# Patient Record
Sex: Male | Born: 1945 | Race: White | Hispanic: No | Marital: Married | Smoking: Former smoker
Health system: Southern US, Community
[De-identification: ages and names within clinical notes are randomized; demographics above are authoritative.]

## PROBLEM LIST (undated history)

## (undated) DIAGNOSIS — I219 Acute myocardial infarction, unspecified: Secondary | ICD-10-CM

## (undated) DIAGNOSIS — I10 Essential (primary) hypertension: Secondary | ICD-10-CM

## (undated) DIAGNOSIS — E119 Type 2 diabetes mellitus without complications: Secondary | ICD-10-CM

## (undated) DIAGNOSIS — I2510 Atherosclerotic heart disease of native coronary artery without angina pectoris: Secondary | ICD-10-CM

## (undated) DIAGNOSIS — I70209 Unspecified atherosclerosis of native arteries of extremities, unspecified extremity: Secondary | ICD-10-CM

## (undated) DIAGNOSIS — I259 Chronic ischemic heart disease, unspecified: Secondary | ICD-10-CM

## (undated) DIAGNOSIS — D869 Sarcoidosis, unspecified: Secondary | ICD-10-CM

## (undated) DIAGNOSIS — N529 Male erectile dysfunction, unspecified: Secondary | ICD-10-CM

## (undated) DIAGNOSIS — K219 Gastro-esophageal reflux disease without esophagitis: Secondary | ICD-10-CM

## (undated) DIAGNOSIS — E782 Mixed hyperlipidemia: Secondary | ICD-10-CM

## (undated) DIAGNOSIS — R7303 Prediabetes: Secondary | ICD-10-CM

## (undated) HISTORY — PX: REPLACEMENT TOTAL KNEE BILATERAL: SUR1225

## (undated) HISTORY — PX: COLONOSCOPY: SHX174

## (undated) HISTORY — PX: HERNIA REPAIR: SHX51

## (undated) HISTORY — PX: OTHER SURGICAL HISTORY: SHX169

---

## 2005-03-13 HISTORY — PX: OTHER SURGICAL HISTORY: SHX169

## 2005-09-08 ENCOUNTER — Ambulatory Visit: Payer: Self-pay | Admitting: Gastroenterology

## 2005-12-20 ENCOUNTER — Other Ambulatory Visit: Payer: Self-pay

## 2005-12-20 ENCOUNTER — Inpatient Hospital Stay: Payer: Self-pay | Admitting: Internal Medicine

## 2005-12-23 ENCOUNTER — Other Ambulatory Visit: Payer: Self-pay

## 2005-12-23 ENCOUNTER — Inpatient Hospital Stay: Payer: Self-pay | Admitting: Internal Medicine

## 2005-12-24 ENCOUNTER — Other Ambulatory Visit: Payer: Self-pay

## 2005-12-25 ENCOUNTER — Other Ambulatory Visit: Payer: Self-pay

## 2006-01-10 ENCOUNTER — Encounter: Payer: Self-pay | Admitting: Internal Medicine

## 2006-01-11 ENCOUNTER — Encounter: Payer: Self-pay | Admitting: Internal Medicine

## 2006-02-10 ENCOUNTER — Encounter: Payer: Self-pay | Admitting: Internal Medicine

## 2006-03-13 ENCOUNTER — Encounter: Payer: Self-pay | Admitting: Internal Medicine

## 2006-04-13 ENCOUNTER — Encounter: Payer: Self-pay | Admitting: Internal Medicine

## 2006-05-12 ENCOUNTER — Encounter: Payer: Self-pay | Admitting: Internal Medicine

## 2006-06-12 ENCOUNTER — Encounter: Payer: Self-pay | Admitting: Internal Medicine

## 2007-05-29 ENCOUNTER — Ambulatory Visit: Payer: Self-pay

## 2007-06-25 ENCOUNTER — Ambulatory Visit: Payer: Self-pay | Admitting: Orthopaedic Surgery

## 2007-07-02 ENCOUNTER — Ambulatory Visit: Payer: Self-pay | Admitting: Orthopaedic Surgery

## 2007-09-30 ENCOUNTER — Ambulatory Visit: Payer: Self-pay | Admitting: General Practice

## 2007-10-14 ENCOUNTER — Inpatient Hospital Stay: Payer: Self-pay | Admitting: General Practice

## 2009-01-01 ENCOUNTER — Emergency Department: Payer: Self-pay | Admitting: Emergency Medicine

## 2010-01-17 ENCOUNTER — Ambulatory Visit: Payer: Self-pay | Admitting: Surgery

## 2010-02-07 ENCOUNTER — Ambulatory Visit: Payer: Self-pay | Admitting: Surgery

## 2011-01-04 ENCOUNTER — Ambulatory Visit: Payer: Self-pay | Admitting: Gastroenterology

## 2013-08-07 DIAGNOSIS — N529 Male erectile dysfunction, unspecified: Secondary | ICD-10-CM | POA: Insufficient documentation

## 2013-08-07 DIAGNOSIS — K219 Gastro-esophageal reflux disease without esophagitis: Secondary | ICD-10-CM | POA: Insufficient documentation

## 2013-10-31 ENCOUNTER — Ambulatory Visit: Payer: Self-pay | Admitting: Gastroenterology

## 2015-02-19 DIAGNOSIS — E119 Type 2 diabetes mellitus without complications: Secondary | ICD-10-CM | POA: Insufficient documentation

## 2016-05-30 ENCOUNTER — Ambulatory Visit: Admit: 2016-05-30 | Payer: Self-pay | Admitting: Gastroenterology

## 2016-05-30 SURGERY — COLONOSCOPY WITH PROPOFOL
Anesthesia: General

## 2016-09-15 ENCOUNTER — Encounter: Payer: Self-pay | Admitting: *Deleted

## 2016-09-18 ENCOUNTER — Ambulatory Visit: Payer: Medicare Other | Admitting: Anesthesiology

## 2016-09-18 ENCOUNTER — Ambulatory Visit
Admission: RE | Admit: 2016-09-18 | Discharge: 2016-09-18 | Disposition: A | Discharge status: Home / Self Care | Payer: Medicare Other | Source: Ambulatory Visit | Attending: Gastroenterology | Admitting: Gastroenterology

## 2016-09-18 ENCOUNTER — Encounter
Admission: RE | Disposition: A | Discharge status: Home / Self Care | Payer: Self-pay | Source: Ambulatory Visit | Attending: Gastroenterology

## 2016-09-18 DIAGNOSIS — N529 Male erectile dysfunction, unspecified: Secondary | ICD-10-CM | POA: Insufficient documentation

## 2016-09-18 DIAGNOSIS — I252 Old myocardial infarction: Secondary | ICD-10-CM | POA: Diagnosis not present

## 2016-09-18 DIAGNOSIS — Z800 Family history of malignant neoplasm of digestive organs: Secondary | ICD-10-CM | POA: Diagnosis not present

## 2016-09-18 DIAGNOSIS — Z7902 Long term (current) use of antithrombotics/antiplatelets: Secondary | ICD-10-CM | POA: Diagnosis not present

## 2016-09-18 DIAGNOSIS — Z87891 Personal history of nicotine dependence: Secondary | ICD-10-CM | POA: Insufficient documentation

## 2016-09-18 DIAGNOSIS — E782 Mixed hyperlipidemia: Secondary | ICD-10-CM | POA: Insufficient documentation

## 2016-09-18 DIAGNOSIS — Z1211 Encounter for screening for malignant neoplasm of colon: Secondary | ICD-10-CM | POA: Diagnosis not present

## 2016-09-18 DIAGNOSIS — Z79899 Other long term (current) drug therapy: Secondary | ICD-10-CM | POA: Insufficient documentation

## 2016-09-18 DIAGNOSIS — Z955 Presence of coronary angioplasty implant and graft: Secondary | ICD-10-CM | POA: Insufficient documentation

## 2016-09-18 DIAGNOSIS — K219 Gastro-esophageal reflux disease without esophagitis: Secondary | ICD-10-CM | POA: Diagnosis not present

## 2016-09-18 DIAGNOSIS — R7303 Prediabetes: Secondary | ICD-10-CM | POA: Diagnosis not present

## 2016-09-18 DIAGNOSIS — I2510 Atherosclerotic heart disease of native coronary artery without angina pectoris: Secondary | ICD-10-CM | POA: Diagnosis not present

## 2016-09-18 DIAGNOSIS — Z86010 Personal history of colonic polyps: Secondary | ICD-10-CM | POA: Insufficient documentation

## 2016-09-18 DIAGNOSIS — K5730 Diverticulosis of large intestine without perforation or abscess without bleeding: Secondary | ICD-10-CM | POA: Insufficient documentation

## 2016-09-18 DIAGNOSIS — I10 Essential (primary) hypertension: Secondary | ICD-10-CM | POA: Insufficient documentation

## 2016-09-18 DIAGNOSIS — I259 Chronic ischemic heart disease, unspecified: Secondary | ICD-10-CM | POA: Insufficient documentation

## 2016-09-18 DIAGNOSIS — K635 Polyp of colon: Secondary | ICD-10-CM | POA: Insufficient documentation

## 2016-09-18 DIAGNOSIS — K640 First degree hemorrhoids: Secondary | ICD-10-CM | POA: Insufficient documentation

## 2016-09-18 DIAGNOSIS — Z7982 Long term (current) use of aspirin: Secondary | ICD-10-CM | POA: Diagnosis not present

## 2016-09-18 HISTORY — DX: Gastro-esophageal reflux disease without esophagitis: K21.9

## 2016-09-18 HISTORY — DX: Acute myocardial infarction, unspecified: I21.9

## 2016-09-18 HISTORY — DX: Essential (primary) hypertension: I10

## 2016-09-18 HISTORY — DX: Prediabetes: R73.03

## 2016-09-18 HISTORY — PX: COLONOSCOPY WITH PROPOFOL: SHX5780

## 2016-09-18 HISTORY — DX: Male erectile dysfunction, unspecified: N52.9

## 2016-09-18 HISTORY — DX: Atherosclerotic heart disease of native coronary artery without angina pectoris: I25.10

## 2016-09-18 HISTORY — DX: Mixed hyperlipidemia: E78.2

## 2016-09-18 HISTORY — DX: Chronic ischemic heart disease, unspecified: I25.9

## 2016-09-18 SURGERY — COLONOSCOPY WITH PROPOFOL
Anesthesia: General

## 2016-09-18 MED ORDER — PROPOFOL 500 MG/50ML IV EMUL
INTRAVENOUS | Status: DC | PRN
  Administered 2016-09-18: 120 ug/kg/min via INTRAVENOUS

## 2016-09-18 MED ORDER — PROPOFOL 10 MG/ML IV BOLUS
INTRAVENOUS | Status: DC | PRN
  Administered 2016-09-18: 90 mg via INTRAVENOUS

## 2016-09-18 MED ORDER — SODIUM CHLORIDE 0.9 % IV SOLN: INTRAVENOUS | Status: DC

## 2016-09-18 MED ORDER — PROPOFOL 500 MG/50ML IV EMUL
INTRAVENOUS | Status: AC
  Filled 2016-09-18: qty 50

## 2016-09-18 MED ORDER — SODIUM CHLORIDE 0.9 % IV SOLN
INTRAVENOUS | Status: DC
  Administered 2016-09-18: 07:00:00 via INTRAVENOUS

## 2016-09-18 NOTE — Op Note (Signed)
[facility] Gastroenterology Patient Name: Casey Dyer Procedure Date: 09/18/2016 7:16 AM MRN: [id number] Account #: [id number] Date of Birth: 04-13-1945 Admit Type: Outpatient Age: [age] Room: [facility] 3 Gender: Male Note Status: Finalized Procedure:            Colonoscopy Indications:          Family history of colon cancer in a first-degree                        relative, Personal history of colonic polyps Providers:            Christena DeemMartin U. Santhosh Gulino, MD Referring MD:         Teena Iraniavid M. Terance HartBronstein, MD (Referring MD) Medicines:            Monitored Anesthesia Care Complications:        No immediate complications. Procedure:            Pre-Anesthesia Assessment:                       - ASA Grade Assessment: III - A patient with severe                        systemic disease.                       After obtaining informed consent, the colonoscope was                        passed under direct vision. Throughout the procedure,                        the patient's blood pressure, pulse, and oxygen                        saturations were monitored continuously. The                        Colonoscope was introduced through the anus and                        advanced to the the cecum, identified by appendiceal                        orifice and ileocecal valve. The colonoscopy was                        performed without difficulty. The patient tolerated the                        procedure well. The quality of the bowel preparation                        was good. Findings:      Multiple medium-mouthed diverticula were found in the sigmoid colon,       descending colon, transverse colon and ascending colon.      A 3 mm polyp was found in the mid sigmoid colon. The polyp was sessile.       The polyp was removed with a cold biopsy forceps. Resection and       retrieval were complete.  Non-bleeding internal hemorrhoids were found during digital exam and   during anoscopy. The hemorrhoids were small and Grade I (internal       hemorrhoids that do not prolapse). Impression:           - Diverticulosis in the sigmoid colon, in the                        descending colon, in the transverse colon and in the                        ascending colon.                       - One 3 mm polyp in the mid sigmoid colon, removed with                        a cold biopsy forceps. Resected and retrieved.                       - Non-bleeding internal hemorrhoids. Recommendation:       - Await pathology results.                       - Telephone GI clinic for pathology results in 1 week. Procedure Code(s):    --- Professional ---                       [phone], Colonoscopy, flexible; with biopsy, single or                        multiple Diagnosis Code(s):    --- Professional ---                       K64.0, First degree hemorrhoids                       D12.5, Benign neoplasm of sigmoid colon                       Z80.0, Family history of malignant neoplasm of                        digestive organs                       Z86.010, Personal history of colonic polyps                       K57.30, Diverticulosis of large intestine without                        perforation or abscess without bleeding CPT copyright 2016 American [employer]. All rights reserved. The codes documented in this report are preliminary and upon coder review may  be revised to meet current compliance requirements. Christena Deem, MD 09/18/2016 8:10:09 AM This report has been signed electronically. Number of Addenda: 0 Note Initiated On: 09/18/2016 7:16 AM Scope Withdrawal Time: 0 hours 12 minutes 39 seconds  Total Procedure Duration: 0 hours 20 minutes 17 seconds       [facility]

## 2016-09-18 NOTE — H&P (Signed)
Outpatient short stay form Pre-procedure 09/18/2016 7:39 AM Christena DeemMartin U Jager Koska MD  Primary Physician: Dr. Dorothey Basemanavid Bronstein, Dr. Arnoldo HookerBruce Kowalski  Reason for visit:  Colonoscopy  History of present illness:  Patient is a 71 year old male presenting today as above. He tolerated his prep well. He takes Plavix and aspirin having held the Plavix now for 7 days and his aspirin for about 3. He takes no other blood thinning agents or aspirin products.    Current Facility-Administered Medications:  .  0.9 %  sodium chloride infusion, , Intravenous, Continuous, Christena DeemSkulskie, Myya Meenach U, MD, Last Rate: 20 mL/hr at 09/18/16 0727 .  0.9 %  sodium chloride infusion, , Intravenous, Continuous, Christena DeemSkulskie, Lathen Seal U, MD  Prescriptions Prior to Admission  Medication Sig Dispense Refill Last Dose  . aspirin 81 MG chewable tablet Chew by mouth daily.   09/13/2016  . celecoxib (CELEBREX) 200 MG capsule Take 200 mg by mouth 2 (two) times daily.   09/13/2016  . clopidogrel (PLAVIX) 75 MG tablet Take 75 mg by mouth daily.   09/11/2016  . glucosamine-chondroitin 500-400 MG tablet Take 1 tablet by mouth 3 (three) times daily.   09/13/2016  . lisinopril (PRINIVIL,ZESTRIL) 20 MG tablet Take 20 mg by mouth daily.   09/18/2016 at Unknown time  . metoprolol succinate (TOPROL-XL) 25 MG 24 hr tablet Take 25 mg by mouth daily.   Past Week at Unknown time  . Multiple Vitamins-Minerals (MULTIVITAMIN ADULT PO) Take by mouth.   09/13/2016  . Omega-3 Fatty Acids (FISH OIL) 1000 MG CAPS Take by mouth.   09/13/2016  . ranitidine (ZANTAC) 150 MG capsule Take 150 mg by mouth 2 (two) times daily.   Past Week at Unknown time  . simvastatin (ZOCOR) 40 MG tablet Take 40 mg by mouth daily.   09/17/2016  . tadalafil (CIALIS) 20 MG tablet Take 20 mg by mouth daily as needed for erectile dysfunction.   Past Week at Unknown time     No Known Allergies   Past Medical History:  Diagnosis Date  . Chronic ischemic heart disease   . Coronary artery disease   . ED  (erectile dysfunction)   . GERD (gastroesophageal reflux disease)   . Hypertension   . Mixed hyperlipidemia   . Myocardial infarction (HCC)    2007  . Pre-diabetes    borderline     Review of systems:      Physical Exam    Heart and lungs: Regular rate and rhythm without rub or gallop, lungs are bilaterally clear.    HEENT: Normocephalic atraumatic eyes are anicteric    Other:     Pertinant exam for procedure: Soft nontender nondistended bowel sounds positive normoactive.    Planned proceedures: Colonoscopy and indicated procedures. I have discussed the risks benefits and complications of procedures to include not limited to bleeding, infection, perforation and the risk of sedation and the patient wishes to proceed.    Christena DeemMartin U Sahirah Rudell, MD Gastroenterology 09/18/2016  7:39 AM

## 2016-09-18 NOTE — Anesthesia Post-op Follow-up Note (Cosign Needed)
Anesthesia QCDR form completed.        

## 2016-09-18 NOTE — Anesthesia Preprocedure Evaluation (Signed)
Anesthesia Evaluation  Patient identified by MRN, date of birth, ID band Patient awake    Reviewed: Allergy & Precautions, H&P , NPO status , Patient's Chart, lab work & pertinent test results  History of Anesthesia Complications Negative for: history of anesthetic complications  Airway Mallampati: III  TM Distance: <3 FB Neck ROM: limited    Dental  (+) Chipped, Caps   Pulmonary neg shortness of breath, former smoker,           Cardiovascular Exercise Tolerance: Good hypertension, (-) angina+ CAD, + Past MI and + Cardiac Stents  (-) DOE      Neuro/Psych negative neurological ROS  negative psych ROS   GI/Hepatic Neg liver ROS, GERD  Medicated and Controlled,  Endo/Other  negative endocrine ROS  Renal/GU negative Renal ROS  negative genitourinary   Musculoskeletal   Abdominal   Peds  Hematology negative hematology ROS (+)   Anesthesia Other Findings Past Medical History: No date: Chronic ischemic heart disease No date: Coronary artery disease No date: ED (erectile dysfunction) No date: GERD (gastroesophageal reflux disease) No date: Hypertension No date: Mixed hyperlipidemia No date: Myocardial infarction [facility](HCC)     Comment: 2007 No date: Pre-diabetes     Comment: borderline   Past Surgical History: No date: COLONOSCOPY No date: heart stents No date: HERNIA REPAIR No date: partial right knee replacement Right No date: polpectomy No date: rtk Right  BMI    Body Mass Index:  27.89 kg/m      Reproductive/Obstetrics negative OB ROS                             Anesthesia Physical Anesthesia Plan  ASA: III  Anesthesia Plan: General   Post-op Pain Management:    Induction: Intravenous  PONV Risk Score and Plan:   Airway Management Planned: Natural Airway and Nasal Cannula  Additional Equipment:   Intra-op Plan:   Post-operative Plan:   Informed Consent: I have  reviewed the patients History and Physical, chart, labs and discussed the procedure including the risks, benefits and alternatives for the proposed anesthesia with the patient or authorized representative who has indicated his/her understanding and acceptance.   Dental Advisory Given  Plan Discussed with: Anesthesiologist, CRNA and Surgeon  Anesthesia Plan Comments: (Patient consented for risks of anesthesia including but not limited to:  - adverse reactions to medications - damage to teeth, lips or other oral mucosa - sore throat or hoarseness - Damage to heart, brain, lungs or loss of life  Patient voiced understanding.)        Anesthesia Quick Evaluation

## 2016-09-18 NOTE — Transfer of Care (Signed)
Immediate Anesthesia Transfer of Care Note  Patient: Casey Dyer  Procedure(s) Performed: Procedure(s): COLONOSCOPY WITH PROPOFOL (N/A)  Patient Location: PACU and Endoscopy Unit  Anesthesia Type:General  Level of Consciousness: drowsy and patient cooperative  Airway & Oxygen Therapy: Patient Spontanous Breathing and Patient connected to nasal cannula oxygen  Post-op Assessment: Report given to RN and Post -op Vital signs reviewed and stable  Post vital signs: Reviewed and stable  Last Vitals:  Vitals:   09/18/16 0710 09/18/16 0810  BP: (!) 155/82 117/66  Pulse: 65 68  Resp: 18 20  Temp:  36.4 C    Last Pain:  Vitals:   09/18/16 0810  TempSrc: Tympanic         Complications: No apparent anesthesia complications

## 2016-09-18 NOTE — Anesthesia Postprocedure Evaluation (Signed)
Anesthesia Post Note  Patient: Casey Dyer  Procedure(s) Performed: Procedure(s) (LRB): COLONOSCOPY WITH PROPOFOL (N/A)  Patient location during evaluation: Endoscopy Anesthesia Type: General Level of consciousness: awake and alert Pain management: pain level controlled Vital Signs Assessment: post-procedure vital signs reviewed and stable Respiratory status: spontaneous breathing, nonlabored ventilation, respiratory function stable and patient connected to nasal cannula oxygen Cardiovascular status: blood pressure returned to baseline and stable Postop Assessment: no signs of nausea or vomiting Anesthetic complications: no     Last Vitals:  Vitals:   09/18/16 0830 09/18/16 0840  BP: (!) 146/79 (!) 138/104  Pulse: 62 (!) 57  Resp: 14 11  Temp:      Last Pain:  Vitals:   09/18/16 0810  TempSrc: Tympanic                 Cleda MccreedyJoseph K Piscitello

## 2016-09-19 ENCOUNTER — Encounter: Payer: Self-pay | Admitting: Gastroenterology

## 2016-09-20 LAB — SURGICAL PATHOLOGY

## 2019-05-21 ENCOUNTER — Other Ambulatory Visit: Payer: Self-pay | Admitting: Podiatry

## 2019-05-21 NOTE — Discharge Instructions (Signed)
[facility]  POST OPERATIVE INSTRUCTIONS FOR DR. TROXLER, DR. FOWLER, AND DR. [facility] PODIATRY DEPARTMENT   1. Take your medication as prescribed.  Pain medication should be taken only as needed.  2. Keep the dressing clean, dry and intact.  3. Keep your foot elevated above the heart level for the first 48 hours.  4. Walking to the bathroom and brief periods of walking are acceptable, unless we have instructed you to be non-weight bearing.  5. Always wear your post-op shoe when walking.  Always use your crutches if you are to be non-weight bearing.  6. Do not take a shower. Baths are permissible as long as the foot is kept out of the water.   7. Every hour you are awake:  - Bend your knee 15 times. - Flex foot 15 times - Massage calf 15 times  8. Call [facility] ([phone]) if any of the following problems occur: - You develop a temperature or fever. - The bandage becomes saturated with blood. - Medication does not stop your pain. - Injury of the foot occurs. - Any symptoms of infection including redness, odor, or red streaks running from wound.   General Anesthesia, Adult, Care After This sheet gives you information about how to care for yourself after your procedure. Your health care provider may also give you more specific instructions. If you have problems or questions, contact your health care provider. What can I expect after the procedure? After the procedure, the following side effects are common:  Pain or discomfort at the IV site.  Nausea.  Vomiting.  Sore throat.  Trouble concentrating.  Feeling cold or chills.  Weak or tired.  Sleepiness and fatigue.  Soreness and body aches. These side effects can affect parts of the body that were not involved in surgery. Follow these instructions at home:  For at least 24 hours after the procedure:  Have a responsible adult stay with you. It is  important to have someone help care for you until you are awake and alert.  Rest as needed.  Do not: ? Participate in activities in which you could fall or become injured. ? Drive. ? Use heavy machinery. ? Drink alcohol. ? Take sleeping pills or medicines that cause drowsiness. ? Make important decisions or sign legal documents. ? Take care of children on your own. Eating and drinking  Follow any instructions from your health care provider about eating or drinking restrictions.  When you feel hungry, start by eating small amounts of foods that are soft and easy to digest (bland), such as toast. Gradually return to your regular diet.  Drink enough fluid to keep your urine pale yellow.  If you vomit, rehydrate by drinking water, juice, or clear broth. General instructions  If you have sleep apnea, surgery and certain medicines can increase your risk for breathing problems. Follow instructions from your health care provider about wearing your sleep device: ? Anytime you are sleeping, including during daytime naps. ? While taking prescription pain medicines, sleeping medicines, or medicines that make you drowsy.  Return to your normal activities as told by your health care provider. Ask your health care provider what activities are safe for you.  Take over-the-counter and prescription medicines only as told by your health care provider.  If you smoke, do not smoke without supervision.  Keep all follow-up visits as told by your health care provider. This is important. Contact a health care provider if:    You have nausea or vomiting that does not get better with medicine.  You cannot eat or drink without vomiting.  You have pain that does not get better with medicine.  You are unable to pass urine.  You develop a skin rash.  You have a fever.  You have redness around your IV site that gets worse. Get help right away if:  You have difficulty breathing.  You have chest  pain.  You have blood in your urine or stool, or you vomit blood. Summary  After the procedure, it is common to have a sore throat or nausea. It is also common to feel tired.  Have a responsible adult stay with you for the first 24 hours after general anesthesia. It is important to have someone help care for you until you are awake and alert.  When you feel hungry, start by eating small amounts of foods that are soft and easy to digest (bland), such as toast. Gradually return to your regular diet.  Drink enough fluid to keep your urine pale yellow.  Return to your normal activities as told by your health care provider. Ask your health care provider what activities are safe for you. This information is not intended to replace advice given to you by your health care provider. Make sure you discuss any questions you have with your health care provider. Document Revised: 03/02/2017 Document Reviewed: 10/13/2016 Elsevier Patient Education  2020 [employer]  

## 2019-05-22 ENCOUNTER — Encounter: Payer: Self-pay | Admitting: Podiatry

## 2019-05-22 NOTE — Anesthesia Preprocedure Evaluation (Addendum)
Anesthesia Evaluation  Patient identified by MRN, date of birth, ID band Patient awake    Reviewed: Allergy & Precautions, H&P , NPO status , Patient's Chart, lab work & pertinent test results  Airway Mallampati: II  TM Distance: >3 FB Neck ROM: full    Dental no notable dental hx.    Pulmonary former smoker,    Pulmonary exam normal breath sounds clear to auscultation       Cardiovascular hypertension, + CAD and + Past MI  Normal cardiovascular exam Rhythm:regular Rate:Normal     Neuro/Psych    GI/Hepatic GERD  ,  Endo/Other  diabetes, Type 2, Oral Hypoglycemic Agents  Renal/GU      Musculoskeletal   Abdominal   Peds  Hematology   Anesthesia Other Findings Cardiac eval/clearance in care everywhere from 04/01/2019: The patient is scheduled to undergo toe surgery under general anesthesia. The patient has a known history of coronary artery disease and cardiomyopathy, for which he receives medication management. Medication regimen has been optimized and the patient is not experiencing any acute symptoms related to their pre-existing cardiac diagnoses. EKG performed in the office today shows no significant changes from prior. The scheduled procedure is low risk and the patient is considered at lowest possible risk for cardiac complications from sedation, so they may proceed to their scheduled procedure as scheduled.   Reproductive/Obstetrics                            Anesthesia Physical Anesthesia Plan  ASA: III  Anesthesia Plan: General LMA   Post-op Pain Management:    Induction:   PONV Risk Score and Plan: 2 and Treatment may vary due to age or medical condition, Ondansetron, Dexamethasone and Midazolam  Airway Management Planned:   Additional Equipment:   Intra-op Plan:   Post-operative Plan:   Informed Consent: I have reviewed the patients History and Physical, chart, labs and  discussed the procedure including the risks, benefits and alternatives for the proposed anesthesia with the patient or authorized representative who has indicated his/her understanding and acceptance.     Dental Advisory Given  Plan Discussed with: CRNA  Anesthesia Plan Comments:         Anesthesia Quick Evaluation

## 2019-05-27 ENCOUNTER — Other Ambulatory Visit
Admission: RE | Admit: 2019-05-27 | Discharge: 2019-05-27 | Disposition: A | Discharge status: Home / Self Care | Payer: Medicare Other | Source: Ambulatory Visit | Attending: Podiatry | Admitting: Podiatry

## 2019-05-27 ENCOUNTER — Other Ambulatory Visit: Payer: Self-pay

## 2019-05-27 DIAGNOSIS — Z20822 Contact with and (suspected) exposure to covid-19: Secondary | ICD-10-CM | POA: Insufficient documentation

## 2019-05-27 DIAGNOSIS — Z01812 Encounter for preprocedural laboratory examination: Secondary | ICD-10-CM | POA: Diagnosis present

## 2019-05-27 LAB — SARS CORONAVIRUS 2 (TAT 6-24 HRS): SARS Coronavirus 2: NEGATIVE

## 2019-05-29 ENCOUNTER — Ambulatory Visit: Payer: Medicare Other | Admitting: Anesthesiology

## 2019-05-29 ENCOUNTER — Encounter
Admission: RE | Disposition: A | Discharge status: Home / Self Care | Payer: Self-pay | Source: Home / Self Care | Attending: Podiatry

## 2019-05-29 ENCOUNTER — Ambulatory Visit
Admission: RE | Admit: 2019-05-29 | Discharge: 2019-05-29 | Disposition: A | Discharge status: Home / Self Care | Payer: Medicare Other | Attending: Podiatry | Admitting: Podiatry

## 2019-05-29 ENCOUNTER — Other Ambulatory Visit: Payer: Self-pay

## 2019-05-29 ENCOUNTER — Encounter: Payer: Self-pay | Admitting: Podiatry

## 2019-05-29 DIAGNOSIS — I119 Hypertensive heart disease without heart failure: Secondary | ICD-10-CM | POA: Diagnosis not present

## 2019-05-29 DIAGNOSIS — Z96651 Presence of right artificial knee joint: Secondary | ICD-10-CM | POA: Diagnosis not present

## 2019-05-29 DIAGNOSIS — I252 Old myocardial infarction: Secondary | ICD-10-CM | POA: Diagnosis not present

## 2019-05-29 DIAGNOSIS — K219 Gastro-esophageal reflux disease without esophagitis: Secondary | ICD-10-CM | POA: Insufficient documentation

## 2019-05-29 DIAGNOSIS — Z955 Presence of coronary angioplasty implant and graft: Secondary | ICD-10-CM | POA: Diagnosis not present

## 2019-05-29 DIAGNOSIS — M2041 Other hammer toe(s) (acquired), right foot: Secondary | ICD-10-CM | POA: Diagnosis not present

## 2019-05-29 DIAGNOSIS — D869 Sarcoidosis, unspecified: Secondary | ICD-10-CM | POA: Insufficient documentation

## 2019-05-29 DIAGNOSIS — Z87891 Personal history of nicotine dependence: Secondary | ICD-10-CM | POA: Insufficient documentation

## 2019-05-29 DIAGNOSIS — Z7984 Long term (current) use of oral hypoglycemic drugs: Secondary | ICD-10-CM | POA: Insufficient documentation

## 2019-05-29 DIAGNOSIS — G8929 Other chronic pain: Secondary | ICD-10-CM | POA: Diagnosis not present

## 2019-05-29 DIAGNOSIS — M205X1 Other deformities of toe(s) (acquired), right foot: Secondary | ICD-10-CM | POA: Insufficient documentation

## 2019-05-29 DIAGNOSIS — L97519 Non-pressure chronic ulcer of other part of right foot with unspecified severity: Secondary | ICD-10-CM | POA: Diagnosis not present

## 2019-05-29 DIAGNOSIS — I2510 Atherosclerotic heart disease of native coronary artery without angina pectoris: Secondary | ICD-10-CM | POA: Insufficient documentation

## 2019-05-29 DIAGNOSIS — Z79899 Other long term (current) drug therapy: Secondary | ICD-10-CM | POA: Insufficient documentation

## 2019-05-29 DIAGNOSIS — E11621 Type 2 diabetes mellitus with foot ulcer: Secondary | ICD-10-CM | POA: Insufficient documentation

## 2019-05-29 DIAGNOSIS — I43 Cardiomyopathy in diseases classified elsewhere: Secondary | ICD-10-CM | POA: Diagnosis not present

## 2019-05-29 DIAGNOSIS — N529 Male erectile dysfunction, unspecified: Secondary | ICD-10-CM | POA: Insufficient documentation

## 2019-05-29 DIAGNOSIS — Z7982 Long term (current) use of aspirin: Secondary | ICD-10-CM | POA: Diagnosis not present

## 2019-05-29 DIAGNOSIS — E785 Hyperlipidemia, unspecified: Secondary | ICD-10-CM | POA: Insufficient documentation

## 2019-05-29 HISTORY — PX: BONE EXCISION: SHX6730

## 2019-05-29 HISTORY — DX: Type 2 diabetes mellitus without complications: E11.9

## 2019-05-29 LAB — GLUCOSE, CAPILLARY
Glucose-Capillary: 100 mg/dL — ABNORMAL HIGH (ref 70–99)
Glucose-Capillary: 108 mg/dL — ABNORMAL HIGH (ref 70–99)

## 2019-05-29 SURGERY — BONE EXCISION
Anesthesia: General | Site: Foot | Laterality: Right

## 2019-05-29 MED ORDER — BUPIVACAINE HCL (PF) 0.5 % IJ SOLN
INTRAMUSCULAR | Status: DC | PRN
  Administered 2019-05-29: 3 mL
  Administered 2019-05-29: 5 mL

## 2019-05-29 MED ORDER — LACTATED RINGERS IV SOLN: INTRAVENOUS | Status: DC

## 2019-05-29 MED ORDER — CEFAZOLIN SODIUM-DEXTROSE 2-4 GM/100ML-% IV SOLN: 2.0000 g | INTRAVENOUS | Status: DC

## 2019-05-29 MED ORDER — HYDROCODONE-ACETAMINOPHEN 5-325 MG PO TABS: 1.0000 | ORAL_TABLET | Freq: Four times a day (QID) | ORAL | 0 refills | Status: DC | PRN

## 2019-05-29 MED ORDER — POVIDONE-IODINE 7.5 % EX SOLN: Freq: Once | CUTANEOUS | Status: AC

## 2019-05-29 SURGICAL SUPPLY — 27 items
BLADE MINI RND TIP GREEN BEAV (BLADE) ×3 IMPLANT
BLADE OSC/SAGITTAL MD 5.5X18 (BLADE) ×3 IMPLANT
BNDG ESMARK 4X12 TAN STRL LF (GAUZE/BANDAGES/DRESSINGS) ×3 IMPLANT
BNDG GAUZE 4.5X4.1 6PLY STRL (MISCELLANEOUS) ×3 IMPLANT
BNDG STRETCH 4X75 STRL LF (GAUZE/BANDAGES/DRESSINGS) ×3 IMPLANT
CANISTER SUCT 1200ML W/VALVE (MISCELLANEOUS) ×3 IMPLANT
COVER LIGHT HANDLE UNIVERSAL (MISCELLANEOUS) ×6 IMPLANT
CUFF TOURN SGL QUICK 18X4 (TOURNIQUET CUFF) IMPLANT
DRAPE FLUOR MINI C-ARM 54X84 (DRAPES) ×3 IMPLANT
DURAPREP 26ML APPLICATOR (WOUND CARE) ×3 IMPLANT
ELECT REM PT RETURN 9FT ADLT (ELECTROSURGICAL) ×3
ELECTRODE REM PT RTRN 9FT ADLT (ELECTROSURGICAL) ×1 IMPLANT
GAUZE SPONGE 4X4 12PLY STRL (GAUZE/BANDAGES/DRESSINGS) ×3 IMPLANT
GAUZE XEROFORM 1X8 LF (GAUZE/BANDAGES/DRESSINGS) ×3 IMPLANT
GLOVE BIO SURGEON STRL SZ8 (GLOVE) ×3 IMPLANT
GOWN STRL REUS W/ TWL LRG LVL3 (GOWN DISPOSABLE) ×1 IMPLANT
GOWN STRL REUS W/ TWL XL LVL3 (GOWN DISPOSABLE) ×1 IMPLANT
GOWN STRL REUS W/TWL LRG LVL3 (GOWN DISPOSABLE) ×2
GOWN STRL REUS W/TWL XL LVL3 (GOWN DISPOSABLE) ×2
KIT TURNOVER KIT A (KITS) ×3 IMPLANT
NS IRRIG 500ML POUR BTL (IV SOLUTION) ×3 IMPLANT
PACK EXTREMITY ARMC (MISCELLANEOUS) ×3 IMPLANT
PENCIL SMOKE EVACUATOR (MISCELLANEOUS) ×3 IMPLANT
STOCKINETTE STRL 6IN 960660 (GAUZE/BANDAGES/DRESSINGS) ×3 IMPLANT
SUT ETHILON 5-0 FS-2 18 BLK (SUTURE) ×3 IMPLANT
SUT VIC AB 4-0 SH 27 (SUTURE) ×2
SUT VIC AB 4-0 SH 27XANBCTRL (SUTURE) ×1 IMPLANT

## 2019-05-29 NOTE — Op Note (Signed)
Operative note   Surgeon: Dr. Recardo Evangelist, DPM.    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  Resection middle phalanx head right second toe           EBL: Less than 5 cc    Anesthesia:general Anesthesia team I delivered preop and postop local anesthesia with 2 mL 0.25% Marcaine plain at each session    Hemostasis: Ankle tourniquet.  225 mils mercury pressure for 9 minutes   Specimen: None    Complications: None    Operative indications: Chronic pain with recurrent ulceration to the tip of the second toe secondary to the abnormal contracture of the digit.    Procedure:  Patient was brought into the OR and placed on the operating table in thesupine position. After anesthesia was obtained theright lower extremity was prepped and draped in usual sterile fashion.  Operative Report: At this time attention directed to the second toe of the right foot where a 1.2 cm incision was made over the DIP joint.  This was deepened sharp blunt dissection bleeders clamped bovied as required.  The extensor tendon overlying the joint identified incised transversely reflected proximally.  The head of middle phalanx was then identified and resected with power equipment.  There was an copiously irrigated and the tendon was reapproximated with Vicryl simple erupted sutures.  A total long 4 sutures to accomplish that.  Skin was then closed with combination of simple erupted horizontal mattress sutures.  There is a block with the Marcaine once again under sterile compressive dressing was placed across wound consisting of Xeroform gauze 4 x 4's conform and Kling.    Patient tolerated the procedure and anesthesia well.  Was transported from the OR to the PACU with all vital signs stable and vascular status intact. To be discharged per routine protocol.  Will follow up  in approximately 1 week in the outpatient clinic.

## 2019-05-29 NOTE — Transfer of Care (Signed)
Immediate Anesthesia Transfer of Care Note  Patient: Casey Dyer  Procedure(s) Performed: PARTIAL EXCISION BONE PHALANX 2ND RIGHT (Right Foot)  Patient Location: PACU  Anesthesia Type: General LMA  Level of Consciousness: awake, alert  and patient cooperative  Airway and Oxygen Therapy: Patient Spontanous Breathing and Patient connected to supplemental oxygen  Post-op Assessment: Post-op Vital signs reviewed, Patient's Cardiovascular Status Stable, Respiratory Function Stable, Patent Airway and No signs of Nausea or vomiting  Post-op Vital Signs: Reviewed and stable  Complications: No apparent anesthesia complications

## 2019-05-29 NOTE — Anesthesia Postprocedure Evaluation (Signed)
Anesthesia Post Note  Patient: Casey Dyer  Procedure(s) Performed: PARTIAL EXCISION BONE PHALANX 2ND RIGHT (Right Foot)     Patient location during evaluation: PACU Anesthesia Type: General Level of consciousness: awake and alert and oriented Pain management: satisfactory to patient Vital Signs Assessment: post-procedure vital signs reviewed and stable Respiratory status: spontaneous breathing, nonlabored ventilation and respiratory function stable Cardiovascular status: blood pressure returned to baseline and stable Postop Assessment: Adequate PO intake and No signs of nausea or vomiting Anesthetic complications: no    Cherly [place]

## 2019-05-29 NOTE — H&P (Signed)
H and P has been reviewed and no changes are noted.  Reviewed use medical history and current medications and goes.  Stable for surgery at this point.

## 2019-05-30 ENCOUNTER — Encounter: Payer: Self-pay | Admitting: *Deleted

## 2019-09-30 ENCOUNTER — Other Ambulatory Visit: Payer: Self-pay | Admitting: Family Medicine

## 2019-09-30 ENCOUNTER — Other Ambulatory Visit: Payer: Self-pay

## 2019-09-30 ENCOUNTER — Ambulatory Visit
Admission: RE | Admit: 2019-09-30 | Discharge: 2019-09-30 | Disposition: A | Discharge status: Home / Self Care | Payer: Medicare Other | Source: Ambulatory Visit | Attending: Family Medicine | Admitting: Family Medicine

## 2019-09-30 DIAGNOSIS — R600 Localized edema: Secondary | ICD-10-CM | POA: Diagnosis present

## 2019-10-01 ENCOUNTER — Other Ambulatory Visit: Payer: Self-pay

## 2019-10-01 ENCOUNTER — Emergency Department: Payer: Medicare Other

## 2019-10-01 ENCOUNTER — Encounter: Payer: Self-pay | Admitting: *Deleted

## 2019-10-01 ENCOUNTER — Emergency Department
Admission: EM | Admit: 2019-10-01 | Discharge: 2019-10-01 | Disposition: A | Discharge status: Home / Self Care | Payer: Medicare Other | Attending: Emergency Medicine | Admitting: Emergency Medicine

## 2019-10-01 DIAGNOSIS — M79604 Pain in right leg: Secondary | ICD-10-CM | POA: Insufficient documentation

## 2019-10-01 DIAGNOSIS — R2241 Localized swelling, mass and lump, right lower limb: Secondary | ICD-10-CM | POA: Diagnosis not present

## 2019-10-01 DIAGNOSIS — G8929 Other chronic pain: Secondary | ICD-10-CM | POA: Diagnosis not present

## 2019-10-01 DIAGNOSIS — Z87891 Personal history of nicotine dependence: Secondary | ICD-10-CM | POA: Diagnosis not present

## 2019-10-01 DIAGNOSIS — E119 Type 2 diabetes mellitus without complications: Secondary | ICD-10-CM | POA: Diagnosis not present

## 2019-10-01 DIAGNOSIS — L03115 Cellulitis of right lower limb: Secondary | ICD-10-CM | POA: Diagnosis not present

## 2019-10-01 DIAGNOSIS — I10 Essential (primary) hypertension: Secondary | ICD-10-CM | POA: Diagnosis not present

## 2019-10-01 DIAGNOSIS — I2510 Atherosclerotic heart disease of native coronary artery without angina pectoris: Secondary | ICD-10-CM | POA: Diagnosis not present

## 2019-10-01 DIAGNOSIS — Z96651 Presence of right artificial knee joint: Secondary | ICD-10-CM | POA: Insufficient documentation

## 2019-10-01 DIAGNOSIS — M7989 Other specified soft tissue disorders: Secondary | ICD-10-CM

## 2019-10-01 LAB — CBC WITH DIFFERENTIAL/PLATELET
Abs Immature Granulocytes: 0.03 10*3/uL (ref 0.00–0.07)
Basophils Absolute: 0.1 10*3/uL (ref 0.0–0.1)
Basophils Relative: 2 %
Eosinophils Absolute: 0.6 10*3/uL — ABNORMAL HIGH (ref 0.0–0.5)
Eosinophils Relative: 7 %
HCT: 39 % (ref 39.0–52.0)
Hemoglobin: 13.2 g/dL (ref 13.0–17.0)
Immature Granulocytes: 0 %
Lymphocytes Relative: 22 %
Lymphs Abs: 1.8 10*3/uL (ref 0.7–4.0)
MCH: 31.7 pg (ref 26.0–34.0)
MCHC: 33.8 g/dL (ref 30.0–36.0)
MCV: 93.5 fL (ref 80.0–100.0)
Monocytes Absolute: 0.9 10*3/uL (ref 0.1–1.0)
Monocytes Relative: 11 %
Neutro Abs: 4.7 10*3/uL (ref 1.7–7.7)
Neutrophils Relative %: 58 %
Platelets: 223 10*3/uL (ref 150–400)
RBC: 4.17 MIL/uL — ABNORMAL LOW (ref 4.22–5.81)
RDW: 12.7 % (ref 11.5–15.5)
WBC: 8.2 10*3/uL (ref 4.0–10.5)
nRBC: 0 % (ref 0.0–0.2)

## 2019-10-01 LAB — COMPREHENSIVE METABOLIC PANEL
ALT: 16 U/L (ref 0–44)
AST: 22 U/L (ref 15–41)
Albumin: 4.1 g/dL (ref 3.5–5.0)
Alkaline Phosphatase: 48 U/L (ref 38–126)
Anion gap: 12 (ref 5–15)
BUN: 24 mg/dL — ABNORMAL HIGH (ref 8–23)
CO2: 24 mmol/L (ref 22–32)
Calcium: 9.1 mg/dL (ref 8.9–10.3)
Chloride: 103 mmol/L (ref 98–111)
Creatinine, Ser: 1.1 mg/dL (ref 0.61–1.24)
GFR calc Af Amer: >60 mL/min (ref >60)
GFR calc non Af Amer: >60 mL/min (ref >60)
Glucose, Bld: 130 mg/dL — ABNORMAL HIGH (ref 70–99)
Potassium: 4.4 mmol/L (ref 3.5–5.1)
Sodium: 139 mmol/L (ref 135–145)
Total Bilirubin: 1.3 mg/dL — ABNORMAL HIGH (ref 0.3–1.2)
Total Protein: 7.3 g/dL (ref 6.5–8.1)

## 2019-10-01 LAB — C-REACTIVE PROTEIN: CRP: 0.6 mg/dL (ref <1.0)

## 2019-10-01 LAB — LACTIC ACID, PLASMA: Lactic Acid, Venous: 1.8 mmol/L (ref 0.5–1.9)

## 2019-10-01 MED ORDER — ACETAMINOPHEN 500 MG PO TABS
1000.0000 mg | ORAL_TABLET | Freq: Once | ORAL | Status: AC
  Administered 2019-10-01: 1000 mg via ORAL
  Filled 2019-10-01: qty 2

## 2019-10-01 NOTE — ED Provider Notes (Signed)
[facility] Emergency Department Provider Note ____________________________________________   First MD Initiated Contact with Patient 10/01/19 0720     (approximate)  I have reviewed the triage vital signs and the nursing notes.  HISTORY  Chief Complaint Leg Pain   HPI Casey Dyer is a 74 y.o. male presents to the ED with acute on chronic right leg pain.    Patient was seen yesterday as an outpatient for edema to his RLE, and ultrasound was used to rule out DVT.  Patient was placed on Keflex to treat cellulitis.  Upcoming appointment in 5 days with podiatry. Otherwise history of HTN, HLD, DM on Metformin and CAD with 4 stents in place.  History of right knee partial arthroplasty remotely.  Patient presents with his son and patient's wife is on speaker phone during our conversation, son and wife providing additional history. Patient and family report that he has had about 3 days of distal right leg pain, swelling and redness.  Patient denies any preceding or inciting trauma.  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  Patient ports pain primarily to the medial portion of his distal right tibia, just proximal to his ankle joint.  Denies pain with ankle mobility.  Reports taken Tylenol last night but waking up this morning with swelling to his distal RLE causing significant discomfort.  He reports that here in the ED the swelling has gone down and his pain has improved alongside this.  Patient reports compliance with his Keflex, taking 2 pills yesterday and 1 this morning as prescribed.  Patient denies any systemic symptoms, including fever, dizziness, syncope, chest pain, shortness of breath, falls or trauma.    Past Medical History:  Diagnosis Date   Chronic ischemic heart disease    Coronary artery  disease    Diabetes mellitus, type 2 (HCC)    ED (erectile dysfunction)    GERD (gastroesophageal reflux disease)    Hypertension    Mixed hyperlipidemia    Myocardial infarction [facility])    2007   Pre-diabetes    borderline     There are no problems to display for this patient.   Past Surgical History:  Procedure Laterality Date   BONE EXCISION Right 05/29/2019   Procedure: PARTIAL EXCISION BONE PHALANX 2ND RIGHT;  Surgeon: Recardo Evangelist, DPM;  Location: [facility];  Service: Podiatry;  Laterality: Right;  LMA LOCAL Diabetic - oral meds   COLONOSCOPY     COLONOSCOPY WITH PROPOFOL N/A 09/18/2016   Procedure: COLONOSCOPY WITH PROPOFOL;  Surgeon: Christena Deem, MD;  Location: [facility];  Service: Endoscopy;  Laterality: N/A;   heart stents  2007   4 stents.  [facility]   HERNIA REPAIR     partial right knee replacement Right    polpectomy     REPLACEMENT TOTAL KNEE BILATERAL     rtk Right     Prior to Admission medications   Medication Sig Start Date End Date Taking? Authorizing Provider  aspirin 81 MG chewable tablet Chew by mouth daily.    [provider]  Cholecalciferol (VITAMIN D3) 20 MCG (800 UNIT) TABS Take by mouth daily.    [provider]  famotidine (PEPCID) 10 MG tablet Take 10 mg by mouth 2 (two) times daily as needed for heartburn or indigestion.    [provider]  glucosamine-chondroitin 500-400 MG tablet Take 1 tablet by mouth 3 (three) times daily.    [provider]  HYDROcodone-acetaminophen (NORCO) 5-325 MG tablet Take 1 tablet by mouth every 6 (six) hours as needed for moderate pain. 05/29/19   Troxler, Molli HazardMatthew, DPM  lisinopril (PRINIVIL,ZESTRIL) 20 MG tablet Take 40 mg by mouth daily.     [provider]  metFORMIN (GLUCOPHAGE-XR) 750 MG 24 hr tablet Take 750 mg by mouth daily.    [provider]  metoprolol succinate (TOPROL-XL) 25 MG 24 hr tablet Take 25 mg by mouth daily.     [provider]  Omega-3 Fatty Acids (FISH OIL) 1000 MG CAPS Take by mouth.    [provider]  simvastatin (ZOCOR) 40 MG tablet Take 40 mg by mouth daily.    [provider]  tadalafil (CIALIS) 20 MG tablet Take 20 mg by mouth daily as needed for erectile dysfunction.    [provider]    Allergies Patient has no known allergies.  History reviewed. No pertinent family history.  Social History Social History   Tobacco Use   Smoking status: Former Smoker    Types: Cigarettes    Quit date: 03/13/1973    Years since quitting: 46.5   Smokeless tobacco: Former NeurosurgeonUser    Quit date: 1966  Vaping Use   Vaping Use: Never used  Substance Use Topics   Alcohol use: Yes    Alcohol/week: 14.0 standard drinks    Types: 14 Glasses of wine per week   Drug use: No    Review of Systems  Constitutional: No fever/chills Eyes: No visual changes. ENT: No sore throat. Cardiovascular: Denies chest pain. Respiratory: Denies shortness of breath. Gastrointestinal: No abdominal pain.  No nausea, no vomiting.  No diarrhea.  No constipation. Genitourinary: Negative for dysuria. Musculoskeletal: Negative for back pain.  Positive for pain to his distal RLE Skin: Positive for skin redness to his distal RLE Neurological: Negative for headaches, focal weakness or numbness.   ____________________________________________   PHYSICAL EXAM:  VITAL SIGNS: ED Triage Vitals  Enc Vitals Group     BP 10/01/19 0618 (!) 149/80     Pulse Rate 10/01/19 0618 (!) 56     Resp 10/01/19 0618 16     Temp 10/01/19 0618 97.7 F (36.5 C)     Temp Source 10/01/19 0618 Oral     SpO2 10/01/19 0618 100 %     Weight 10/01/19 0619 193 lb (87.5 kg)     Height 10/01/19 0619 5\' 11"  (1.803 m)     Head Circumference --      Peak Flow --      Pain Score 10/01/19 0619 8     Pain Loc --      Pain Edu? --      Excl. in GC? --     Constitutional: Alert and oriented. Well appearing and  in no acute distress.  Sitting up in bed, pleasant and conversational in full sentences. Eyes: Conjunctivae are normal. PERRL. EOMI. Head: Atraumatic. Nose: No congestion/rhinnorhea. Mouth/Throat: Mucous membranes are moist.  Oropharynx non-erythematous. Neck: No stridor. No cervical spine tenderness to palpation. Cardiovascular: Normal rate, regular rhythm. Grossly normal heart sounds.  Good peripheral circulation. Respiratory: Normal respiratory effort.  No retractions. Lungs CTAB. Gastrointestinal: Soft , nondistended, nontender to palpation. No abdominal bruits. No CVA tenderness. Musculoskeletal:  No joint effusions. No signs of acute trauma. Distal RLE is slightly more edematous/swollen compared to the left that is pitting in  nature over his distal shin on the right.  Faint lacy erythema primarily to the distal medial right tibia and overlying the medial ankle, does not extend through the foot or to the toes this is flat in nature and does not include any raised lesions.  DP pulses palpable and symmetric bilaterally.  No tenderness to palpation along the toes or foot on the right.  Full and nonpainful active and passive ROM of the right ankle, this does not elicit any pain to range the ankle joint.  Nontender bilateral malleoli.  Neurologic:  Normal speech and language. No gross focal neurologic deficits are appreciated. No gait instability noted. Skin:  Skin is warm, dry and intact. Psychiatric: Mood and affect are normal. Speech and behavior are normal.  ____________________________________________   LABS (all labs ordered are listed, but only abnormal results are displayed)  Labs Reviewed  COMPREHENSIVE METABOLIC PANEL - Abnormal; Notable for the following components:      Result Value   Glucose, Bld 130 (*)    BUN 24 (*)    Total Bilirubin 1.3 (*)    All other components within normal limits  CBC WITH DIFFERENTIAL/PLATELET - Abnormal; Notable for the following components:   RBC  4.17 (*)    Eosinophils Absolute 0.6 (*)    All other components within normal limits  LACTIC ACID, PLASMA  C-REACTIVE PROTEIN   ____________________________________________  12 Lead EKG   ____________________________________________  RADIOLOGY  ED MD interpretation: 2 view x-ray of right tib/fib obtained at concerns for possible bony or subcutaneous pathology, reviewed by me and without evidence of acute pathology  Official radiology report(s): DG Tibia/Fibula Right  Result Date: 10/01/2019 CLINICAL DATA:  Distal RIGHT medial tibial pain and swelling. EXAM: RIGHT TIBIA AND FIBULA - 2 VIEW COMPARISON:  Contralateral knee from 2009. FINDINGS: No definite signs of fracture or dislocation. Signs of unicompartmental, medial compartment knee arthroplasty. Osteopenia. Degenerative changes about the remainder of the knee. IMPRESSION: 1. Signs of unicompartmental, medial compartment knee arthroplasty without acute osseous abnormality. 2. Osteopenia and degenerative changes. Electronically Signed   By: Donzetta Kohut M.D.   On: 10/01/2019 08:42   US Venous Img Lower Unilateral Right (DVT)  Result Date: 09/30/2019 CLINICAL DATA:  Right leg pain and edema EXAM: RIGHT LOWER EXTREMITY VENOUS DOPPLER ULTRASOUND TECHNIQUE: Gray-scale sonography with graded compression, as well as color Doppler and duplex ultrasound were performed to evaluate the lower extremity deep venous systems from the level of the common femoral vein and including the common femoral, femoral, profunda femoral, popliteal and calf veins including the posterior tibial, peroneal and gastrocnemius veins when visible. The superficial great saphenous vein was also interrogated. Spectral Doppler was utilized to evaluate flow at rest and with distal augmentation maneuvers in the common femoral, femoral and popliteal veins. COMPARISON:  None. FINDINGS: Contralateral Common Femoral Vein: Respiratory phasicity is normal and symmetric with the  symptomatic side. No evidence of thrombus. Normal compressibility. Common Femoral Vein: No evidence of thrombus. Normal compressibility, respiratory phasicity and response to augmentation. Saphenofemoral Junction: No evidence of thrombus. Normal compressibility and flow on color Doppler imaging. Profunda Femoral Vein: No evidence of thrombus. Normal compressibility and flow on color Doppler imaging. Femoral Vein: No evidence of thrombus. Normal compressibility, respiratory phasicity and response to augmentation. Popliteal Vein: No evidence of thrombus. Normal compressibility, respiratory phasicity and response to augmentation. Calf Veins: No evidence of thrombus. Normal compressibility and flow on color Doppler imaging. IMPRESSION: No evidence of deep venous thrombosis. Electronically Signed  By: Osvaldo Shipper M.D.   On: 09/30/2019 16:42    ____________________________________________   PROCEDURES  Procedure(s) performed (including Critical Care):  Procedures   ____________________________________________   INITIAL IMPRESSION / ASSESSMENT AND PLAN / ED COURSE  74 year old gentleman presenting with right leg pain consistent with cellulitis and amenable to continued outpatient management.  Patient remained in a mild sinus bradycardia, likely at his baseline due to chronic rate controlled medications.  He has had no chest pain, shortness of breath or syncope.  I believe his pain is due to cellulitis and associated distention from inflammatory swelling.  He has only had 3 doses of his Keflex and I do not believe it has had time to take full effect and for this to count as failure of outpatient management.  We discussed continuing his outpatient Keflex to finish the course as well as adding further conservative measures such as Tylenol, Ace wrapping the affected area to reduce swelling and distention and elevation of the affected extremity.  Performing these tasks within the ED rapidly improved his  symptoms and I believe he is stable for discharge home with PCP follow-up.  Return precautions for the ED were discussed.   Clinical Course as of Oct 01 1603  Wed Oct 01, 2019  1012 Reassessed the patient.  He reports improved pain and swelling symptoms after Tylenol and Ace bandage. Shared decision making with patient and son regarding observation medical admission versus discharge home with close PCP follow-up.  They are agreeable to discharge home and close follow-up with continued Keflex usage.   [DS]    Clinical Course User Index [DS] Delton Prairie, MD     ____________________________________________   FINAL CLINICAL IMPRESSION(S) / ED DIAGNOSES  Final diagnoses:  Right leg pain  Cellulitis of right lower extremity  Right leg swelling     ED Discharge Orders    None       Miyanna Wiersma   Note:  This document was prepared using Dragon voice recognition software and may include unintentional dictation errors.   Delton Prairie, MD 10/01/19 [phone]

## 2019-10-01 NOTE — ED Triage Notes (Signed)
Pt to ED reporting right lower leg pain x 2 days. Pt was seen to rule out DVT and was told he did not have any clots and was placed on cephalexin. Pt reports today he woke and could not bear weight on his leg and redness has worsened.

## 2019-10-01 NOTE — Discharge Instructions (Signed)
You were seen in the ED because of your continued right leg swelling and pain.  I think the pain you are feeling is from the superficial skin infection, cellulitis, as well as the distention of the associated swelling.  Continue taking your Keflex antibiotic as prescribed to finish that whole course.  Additionally, use the Ace wrap to provide light compression to squeeze out some of the swelling and elevate that extremity above the level of your heart.  Use Tylenol to help with the pain  If you develop any worsening symptoms, fevers or additional concerns, please return to the ED

## 2020-01-11 DIAGNOSIS — T84038A Mechanical loosening of other internal prosthetic joint, initial encounter: Secondary | ICD-10-CM | POA: Insufficient documentation

## 2020-01-11 DIAGNOSIS — Z96651 Presence of right artificial knee joint: Secondary | ICD-10-CM | POA: Insufficient documentation

## 2020-01-12 ENCOUNTER — Other Ambulatory Visit: Payer: Self-pay | Admitting: Internal Medicine

## 2020-01-12 DIAGNOSIS — I70219 Atherosclerosis of native arteries of extremities with intermittent claudication, unspecified extremity: Secondary | ICD-10-CM | POA: Insufficient documentation

## 2020-01-12 DIAGNOSIS — I25118 Atherosclerotic heart disease of native coronary artery with other forms of angina pectoris: Secondary | ICD-10-CM

## 2020-01-21 ENCOUNTER — Ambulatory Visit
Admission: RE | Admit: 2020-01-21 | Discharge: 2020-01-21 | Disposition: A | Discharge status: Home / Self Care | Payer: Medicare Other | Source: Ambulatory Visit | Attending: Internal Medicine | Admitting: Internal Medicine

## 2020-01-21 ENCOUNTER — Other Ambulatory Visit: Payer: Self-pay

## 2020-01-21 DIAGNOSIS — I25118 Atherosclerotic heart disease of native coronary artery with other forms of angina pectoris: Secondary | ICD-10-CM | POA: Diagnosis not present

## 2020-01-21 MED ORDER — TECHNETIUM TC 99M TETROFOSMIN IV KIT
31.4790 | PACK | Freq: Once | INTRAVENOUS | Status: AC | PRN
  Administered 2020-01-21: 31.479 via INTRAVENOUS

## 2020-01-21 MED ORDER — TECHNETIUM TC 99M TETROFOSMIN IV KIT
10.6500 | PACK | Freq: Once | INTRAVENOUS | Status: AC | PRN
  Administered 2020-01-21: 10.65 via INTRAVENOUS

## 2020-01-21 MED ORDER — REGADENOSON 0.4 MG/5ML IV SOLN
0.4000 mg | Freq: Once | INTRAVENOUS | Status: AC
  Administered 2020-01-21: 0.4 mg via INTRAVENOUS
  Filled 2020-01-21: qty 5

## 2020-02-02 ENCOUNTER — Other Ambulatory Visit (INDEPENDENT_AMBULATORY_CARE_PROVIDER_SITE_OTHER): Payer: Self-pay | Admitting: Vascular Surgery

## 2020-02-02 DIAGNOSIS — S81809A Unspecified open wound, unspecified lower leg, initial encounter: Secondary | ICD-10-CM

## 2020-02-02 LAB — NM MYOCAR MULTI W/SPECT W/WALL MOTION / EF
Estimated workload: 1 METS
Exercise duration (min): 1 min
Exercise duration (sec): 1 s
LV dias vol: 81 mL (ref 62–150)
LV sys vol: 28 mL
MPHR: 144 {beats}/min
Peak HR: 82 {beats}/min
Percent HR: 56 %
Rest HR: 57 {beats}/min
SDS: 0
SRS: 2
SSS: 1
TID: 1.07

## 2020-02-09 ENCOUNTER — Ambulatory Visit (INDEPENDENT_AMBULATORY_CARE_PROVIDER_SITE_OTHER): Payer: Self-pay | Admitting: Vascular Surgery

## 2020-02-09 ENCOUNTER — Encounter (INDEPENDENT_AMBULATORY_CARE_PROVIDER_SITE_OTHER): Payer: Self-pay

## 2020-02-09 DIAGNOSIS — I6523 Occlusion and stenosis of bilateral carotid arteries: Secondary | ICD-10-CM | POA: Insufficient documentation

## 2020-03-10 ENCOUNTER — Other Ambulatory Visit: Payer: Self-pay

## 2020-03-10 ENCOUNTER — Encounter
Admission: RE | Admit: 2020-03-10 | Discharge: 2020-03-10 | Disposition: A | Discharge status: Home / Self Care | Payer: Medicare Other | Source: Ambulatory Visit | Attending: Orthopedic Surgery | Admitting: Orthopedic Surgery

## 2020-03-10 DIAGNOSIS — Z01812 Encounter for preprocedural laboratory examination: Secondary | ICD-10-CM | POA: Diagnosis present

## 2020-03-10 LAB — URINALYSIS, ROUTINE W REFLEX MICROSCOPIC
Bilirubin Urine: NEGATIVE
Glucose, UA: NEGATIVE mg/dL
Hgb urine dipstick: NEGATIVE
Ketones, ur: NEGATIVE mg/dL
Leukocytes,Ua: NEGATIVE
Nitrite: NEGATIVE
Protein, ur: NEGATIVE mg/dL
Specific Gravity, Urine: 1.017 (ref 1.005–1.030)
pH: 7 (ref 5.0–8.0)

## 2020-03-10 LAB — COMPREHENSIVE METABOLIC PANEL
ALT: 25 U/L (ref 0–44)
AST: 30 U/L (ref 15–41)
Albumin: 4.1 g/dL (ref 3.5–5.0)
Alkaline Phosphatase: 43 U/L (ref 38–126)
Anion gap: 9 (ref 5–15)
BUN: 23 mg/dL (ref 8–23)
CO2: 26 mmol/L (ref 22–32)
Calcium: 9.3 mg/dL (ref 8.9–10.3)
Chloride: 106 mmol/L (ref 98–111)
Creatinine, Ser: 0.97 mg/dL (ref 0.61–1.24)
GFR, Estimated: >60 mL/min (ref >60)
Glucose, Bld: 140 mg/dL — ABNORMAL HIGH (ref 70–99)
Potassium: 3.9 mmol/L (ref 3.5–5.1)
Sodium: 141 mmol/L (ref 135–145)
Total Bilirubin: 1 mg/dL (ref 0.3–1.2)
Total Protein: 7.4 g/dL (ref 6.5–8.1)

## 2020-03-10 LAB — C-REACTIVE PROTEIN: CRP: 1.2 mg/dL — ABNORMAL HIGH (ref <1.0)

## 2020-03-10 LAB — CBC
HCT: 36.6 % — ABNORMAL LOW (ref 39.0–52.0)
Hemoglobin: 12.3 g/dL — ABNORMAL LOW (ref 13.0–17.0)
MCH: 32.3 pg (ref 26.0–34.0)
MCHC: 33.6 g/dL (ref 30.0–36.0)
MCV: 96.1 fL (ref 80.0–100.0)
Platelets: 230 10*3/uL (ref 150–400)
RBC: 3.81 MIL/uL — ABNORMAL LOW (ref 4.22–5.81)
RDW: 12.5 % (ref 11.5–15.5)
WBC: 7.1 10*3/uL (ref 4.0–10.5)
nRBC: 0 % (ref 0.0–0.2)

## 2020-03-10 LAB — HEMOGLOBIN A1C
Hgb A1c MFr Bld: 6.4 % — ABNORMAL HIGH (ref 4.8–5.6)
Mean Plasma Glucose: 136.98 mg/dL

## 2020-03-10 LAB — PROTIME-INR
INR: 1.1 (ref 0.8–1.2)
Prothrombin Time: 13.3 seconds (ref 11.4–15.2)

## 2020-03-10 LAB — SURGICAL PCR SCREEN
MRSA, PCR: NEGATIVE
Staphylococcus aureus: NEGATIVE

## 2020-03-10 LAB — SEDIMENTATION RATE: Sed Rate: 28 mm/hr — ABNORMAL HIGH (ref 0–16)

## 2020-03-10 LAB — APTT: aPTT: 44 seconds — ABNORMAL HIGH (ref 24–36)

## 2020-03-10 NOTE — Patient Instructions (Signed)
Your procedure is scheduled on: Friday March 19, 2020 Report to the Registration Desk on the 1st floor of the [facility] To find out your arrival time, please call [phone] between 1PM - 3PM on: Thursday March 18, 2020  REMEMBER: Instructions that are not followed completely may result in serious medical risk, up to and including death; or upon the discretion of your surgeon and anesthesiologist your surgery may need to be rescheduled.  Do not eat food after midnight the night before surgery.  No gum chewing, lozengers or hard candies.  You may however, drink CLEAR liquids up to 2 hours before you are scheduled to arrive for your surgery. Do not drink anything within 2 hours of your scheduled arrival time.  Clear liquids include: - water   Do NOT drink anything that is not on this list.  Type 1 and Type 2 diabetics should only drink water.   TAKE THESE MEDICATIONS THE MORNING OF SURGERY WITH A SIP OF WATER: PEPCID  (take one the night before and one on the morning of surgery - helps to prevent nausea after surgery.)  Stop Metformin 2 days prior to surgery. LAST DOSE March 16, 2020 TUESDAY    Follow recommendations from Cardiologist, Pulmonologist or PCP regarding stopping Aspirin, Coumadin, Plavix, Eliquis, Pradaxa, or Pletal. LAST DOSE OF ASPIRIN March 14, 2020 SUNDAY  One week prior to surgery: Stop Anti-inflammatories (NSAIDS) such as Advil, Aleve, Ibuprofen, Motrin, Naproxen, Naprosyn and Aspirin based products such as Excedrin, Goodys Powder, BC Powder.     USE TYLENOL IF NEEDED Stop ANY OVER THE COUNTER supplements until after surgery. (However, you may continue taking Vitamin D, Vitamin B, and multivitamin up until the day before surgery.)  No Alcohol for 24 hours before or after surgery.  No Smoking including e-cigarettes for 24 hours prior to surgery.  No chewable tobacco products for at least 6 hours prior to surgery.  No nicotine patches on the day of  surgery.  Do not use any "recreational" drugs for at least a week prior to your surgery.  Please be advised that the combination of cocaine and anesthesia may have negative outcomes, up to and including death. If you test positive for cocaine, your surgery will be cancelled.  On the morning of surgery brush your teeth with toothpaste and water, you may rinse your mouth with mouthwash if you wish. Do not swallow any toothpaste or mouthwash.  Do not wear jewelry, make-up, hairpins, clips or nail polish.  Do not wear lotions, powders, or perfumes.   Do not shave body from the neck down 48 hours prior to surgery just in case you cut yourself which could leave a site for infection.  Also, freshly shaved skin may become irritated if using the CHG soap.  Contact lenses, hearing aids and dentures may not be worn into surgery.  Do not bring valuables to the hospital. [facility] is not responsible for any missing/lost belongings or valuables.   Use CHG Soap as directed on instruction sheet.  Notify your doctor if there is any change in your medical condition (cold, fever, infection).  Wear comfortable clothing (specific to your surgery type) to the hospital.  Plan for stool softeners for home use; pain medications have a tendency to cause constipation. You can also help prevent constipation by eating foods high in fiber such as fruits and vegetables and drinking plenty of fluids as your diet allows.  After surgery, you can help prevent lung complications by doing breathing exercises.  Take deep breaths and cough every 1-2 hours. Your doctor may order a device called an Incentive Spirometer to help you take deep breaths. When coughing or sneezing, hold a pillow firmly against your incision with both hands. This is called "splinting." Doing this helps protect your incision. It also decreases belly discomfort.  If you are being admitted to the hospital overnight. YOU MAY BRING A SMALL BAG WITH  YOU.    If you are being discharged the day of surgery, you will not be allowed to drive home. You will need a responsible adult (18 years or older) to drive you home and stay with you that night.   Please call the Pre-admissions Testing Dept. at [phone] if you have any questions about these instructions.  Visitation Policy:  Patients undergoing a surgery or procedure may have one family member or support person with them as long as that person is not COVID-19 positive or experiencing its symptoms.  That person may remain in the waiting area during the procedure.  Inpatient Visitation Update:   In an effort to ensure the safety of our team members and our patients, we are implementing a change to our visitation policy:  Effective Monday, Aug. 9, at 7 a.m., inpatients will be allowed one support person.  o The support person may change daily.  o The support person must pass our screening, gel in and out, and wear a mask at all times, including in the patient's room.  o Patients must also wear a mask when staff or their support person are in the room.  o Masking is required regardless of vaccination status.  Systemwide, no visitors 17 or younger.

## 2020-03-11 ENCOUNTER — Encounter: Payer: Self-pay | Admitting: Orthopedic Surgery

## 2020-03-11 NOTE — Progress Notes (Signed)
[facility] Perioperative Services  Pre-Admission/Anesthesia Testing Clinical Review  Date: 03/11/20  Patient Demographics:  Name: Casey Dyer DOB:   [birth year] MRN:   [id number]  Planned Surgical Procedure(s):    Case: [id number] Date/Time: 03/19/20 1140   Procedure: RIGHT KNEE REVISION FROM UNI TO A TOTAL (Right Knee)   Anesthesia type: Choice   Pre-op diagnosis:      Loosening of knee joint prosthesis, initial encounter T84.038A, Z96.659     Status post right unicompartmental knee replacement Z96.651   Location: [facility] OR ROOM 03 / [facility]   Surgeons: Donato Heinz, MD    NOTE: Available PAT nursing documentation and vital signs have been reviewed. Clinical nursing staff has updated patient's PMH/PSHx, current medication list, and drug allergies/intolerances to ensure comprehensive history available to assist in medical decision making as it pertains to the aforementioned surgical procedure and anticipated anesthetic course.   Clinical Discussion:  Casey Dyer is a 74 y.o. male who is submitted for pre-surgical anesthesia review and clearance prior to him undergoing the above procedure. Patient is a Former Smoker (quit 03/1973). Pertinent PMH includes: CAD, MI (2007), ischemic heart disease, PVD, HTN, HLD, T2DM, sarcoidosis, GERD (on daily H2 blocker).  Patient is followed by cardiology Gwen Pounds, MD). He was last seen in the cardiology clinic on 02/09/2020; notes reviewed. At the time of his clinic visit, patient was doing well from a cardiovascular standpoint. He denied any chest pain, shortness breath, PND, orthopnea, palpitations, peripheral edema, vertiginous symptoms, or presyncope/syncope. Patient's functional capacity documented as being <4 METS due to orthopedic pain.  patient suffered MI in 2007 that was treated with PCI and stent placement; records not available for review to determine type and location of stent. Last  myocardial perfusion imaging study performed on 06/12/2016 revealed no evidence of stress-induced myocardial ischemia or arrhythmia. TTE performed on 02/04/2020 revealed normal left ventricular systolic function with mild to moderate valvular insufficiency; LVEF 50% (see full interpretation of cardiovascular test below). Hypertension within desired range at 130/80 on currently prescribed ACEi, diuretic, and beta-blocker therapies. Patient is on a statin for his HLD. T2DM well-controlled on currently prescribed biguanide therapy; last Hg A1c 6.7% on 12/16/2019. No other significant changes were made to patient's treatment/medication regimen during this visit. Patient follow-up with outpatient cardiology in 6 months or sooner if needed.  Patient is scheduled to undergo an elective orthopedic procedure on 03/19/2020 with Dr. Francesco Sor. Given patient's past medical history significant for cardiovascular disease and intervention, presurgical cardiac clearance was sought by the performing surgeon's office and PAT team. Per cardiology, "the patient is at the lowest risk possible for cardiovascular complications with surgical intervention. He currently has no evidence of active/significant angina or CHF. The patient may proceed to surgery without restriction or need for further cardiovascular testing at this time". This patient is on daily antiplatelet therapy. He has been instructed on recommendations from his cardiologist for holding his daily low-dose ASA for 4 days prior to his procedure with plans to restart as soon as possible as deemed appropriate by the attending surgeon. The patient has been instructed that his last dose of ASA will be on 03/14/2020.  He denies previous perioperative complications with anesthesia. He underwent a general (LMA) anesthetic course at [facility] (ASA III) in 05/2019 with no documented complications.   Vitals with BMI 03/10/2020 10/01/2019 10/01/2019  Height 5\' 11"  - -   Weight 202 lbs 8 oz - -  BMI  28.26 - -  Systolic 137 - -  Diastolic 71 - -  Pulse 66 50 49    Providers/Specialists:   NOTE: Primary physician provider listed below. Patient may have been seen by APP or partner within same practice.   PROVIDER ROLE / SPECIALTY LAST OV  Hooten, Illene Labrador, MD Orthopedics (Surgeon)  01/06/2020  Dorothey Baseman, MD Primary Care Provider  12/22/2019  Arnoldo Hooker, MD Cardiology  02/09/2020   Allergies:  Patient has no known allergies.  Current Home Medications:   No current facility-administered medications for this encounter.   Marland Kitchen aspirin 81 MG chewable tablet  . Cholecalciferol (VITAMIN D3) 50 MCG (2000 UT) capsule  . famotidine (PEPCID) 10 MG tablet  . glucosamine-chondroitin 500-400 MG tablet  . lisinopril-hydrochlorothiazide (ZESTORETIC) 20-12.5 MG tablet  . metFORMIN (GLUCOPHAGE-XR) 750 MG 24 hr tablet  . metoprolol succinate (TOPROL-XL) 25 MG 24 hr tablet  . Omega-3 Fatty Acids (FISH OIL) 1000 MG CAPS  . simvastatin (ZOCOR) 40 MG tablet  . melatonin 5 MG TABS  . tadalafil (CIALIS) 20 MG tablet   History:   Past Medical History:  Diagnosis Date  . Chronic ischemic heart disease   . Coronary artery disease   . Diabetes mellitus, type 2 (HCC)   . ED (erectile dysfunction)   . GERD (gastroesophageal reflux disease)   . Hypertension   . Mixed hyperlipidemia   . Myocardial infarction (HCC)    2007  . Pre-diabetes    borderline    Past Surgical History:  Procedure Laterality Date  . BONE EXCISION Right 05/29/2019   Procedure: PARTIAL EXCISION BONE PHALANX 2ND RIGHT;  Surgeon: Recardo Evangelist, DPM;  Location: [facility];  Service: Podiatry;  Laterality: Right;  LMA LOCAL Diabetic - oral meds  . COLONOSCOPY    . COLONOSCOPY WITH PROPOFOL N/A 09/18/2016   Procedure: COLONOSCOPY WITH PROPOFOL;  Surgeon: Christena Deem, MD;  Location: [facility] ENDOSCOPY;  Service: Endoscopy;  Laterality: N/A;  . heart stents  2007   4  stents.  [facility]  . HERNIA REPAIR     UMBILICUS  . partial right knee replacement Right   . polpectomy    . REPLACEMENT TOTAL KNEE BILATERAL Left   . rtk Right    No family history on file. Social History   Tobacco Use  . Smoking status: Former Smoker    Types: Cigarettes    Quit date: 03/13/1973    Years since quitting: 47.0  . Smokeless tobacco: Never Used  Vaping Use  . Vaping Use: Never used  Substance Use Topics  . Alcohol use: Yes    Alcohol/week: 14.0 standard drinks    Types: 14 Glasses of wine per week  . Drug use: No    Pertinent Clinical Results:  LABS: Labs reviewed: Acceptable for surgery.  Hospital Outpatient Visit on 03/10/2020  Component Date Value Ref Range Status  . CRP 03/10/2020 1.2* <1.0 mg/dL Final   Performed at [facility], [address], [place], [place] [address]  . Hgb A1c MFr Bld 03/10/2020 6.4* 4.8 - 5.6 % Final   Comment: (NOTE) Pre diabetes:          5.7%-6.4%  Diabetes:              >6.4%  Glycemic control for   <7.0% adults with diabetes   . Mean Plasma Glucose 03/10/2020 136.98  mg/dL Final   Performed at [facility], [address], [place], [place] [address]  . MRSA,  PCR 03/10/2020 NEGATIVE  NEGATIVE Final  . Staphylococcus aureus 03/10/2020 NEGATIVE  NEGATIVE Final   Comment: (NOTE) The Xpert SA Assay (FDA approved for NASAL specimens in patients 52 years of age and older), is one component of a comprehensive surveillance program. It is not intended to diagnose infection nor to guide or monitor treatment. Performed at [facility], [address] [place], [place] [address]   . Sed Rate 03/10/2020 28* 0 - 16 mm/hr Final   Performed at [facility], [address], [place], [place] [address]  . WBC 03/10/2020 7.1  4.0 - 10.5 K/uL Final  . RBC 03/10/2020 3.81* 4.22 - 5.81 MIL/uL Final  . Hemoglobin 03/10/2020 12.3* 13.0 - 17.0 g/dL Final  . HCT 00/92/3300 36.6* 39.0 - 52.0 % Final  . MCV  03/10/2020 96.1  80.0 - 100.0 fL Final  . MCH 03/10/2020 32.3  26.0 - 34.0 pg Final  . MCHC 03/10/2020 33.6  30.0 - 36.0 g/dL Final  . RDW 76/22/6333 12.5  11.5 - 15.5 % Final  . Platelets 03/10/2020 230  150 - 400 K/uL Final  . nRBC 03/10/2020 0.0  0.0 - 0.2 % Final   Performed at [facility], [address] [place], [place] [address]  . Sodium 03/10/2020 141  135 - 145 mmol/L Final  . Potassium 03/10/2020 3.9  3.5 - 5.1 mmol/L Final  . Chloride 03/10/2020 106  98 - 111 mmol/L Final  . CO2 03/10/2020 26  22 - 32 mmol/L Final  . Glucose, Bld 03/10/2020 140* 70 - 99 mg/dL Final   Glucose reference range applies only to samples taken after fasting for at least 8 hours.  . BUN 03/10/2020 23  8 - 23 mg/dL Final  . Creatinine, Ser 03/10/2020 0.97  0.61 - 1.24 mg/dL Final  . Calcium 56/38/9373 9.3  8.9 - 10.3 mg/dL Final  . Total Protein 03/10/2020 7.4  6.5 - 8.1 g/dL Final  . Albumin 42/87/6811 4.1  3.5 - 5.0 g/dL Final  . AST 57/26/2035 30  15 - 41 U/L Final  . ALT 03/10/2020 25  0 - 44 U/L Final  . Alkaline Phosphatase 03/10/2020 43  38 - 126 U/L Final  . Total Bilirubin 03/10/2020 1.0  0.3 - 1.2 mg/dL Final  . GFR, Estimated 03/10/2020 >60  >60 mL/min Final   Comment: (NOTE) Calculated using the CKD-EPI Creatinine Equation (2021)   . Anion gap 03/10/2020 9  5 - 15 Final   Performed at [facility], [address] [place] [place], [place] [address]  . Prothrombin Time 03/10/2020 13.3  11.4 - 15.2 seconds Final  . INR 03/10/2020 1.1  0.8 - 1.2 Final   Comment: (NOTE) INR goal varies based on device and disease states. Performed at [facility], [address], [place], [place] [address]   . aPTT 03/10/2020 44* 24 - 36 seconds Final   Comment:        IF BASELINE aPTT IS ELEVATED, SUGGEST PATIENT RISK ASSESSMENT BE USED TO DETERMINE APPROPRIATE ANTICOAGULANT THERAPY. Performed at [facility], [address], [place], [place] [address]   .  Color, Urine 03/10/2020 YELLOW* YELLOW Final  . APPearance 03/10/2020 HAZY* CLEAR Final  . Specific Gravity, Urine 03/10/2020 1.017  1.005 - 1.030 Final  . pH 03/10/2020 7.0  5.0 - 8.0 Final  . Glucose, UA 03/10/2020 NEGATIVE  NEGATIVE mg/dL Final  . Hgb urine dipstick 03/10/2020 NEGATIVE  NEGATIVE Final  . Bilirubin Urine 03/10/2020  NEGATIVE  NEGATIVE Final  . Ketones, ur 03/10/2020 NEGATIVE  NEGATIVE mg/dL Final  . Protein, ur 16/10/960412/29/2021 NEGATIVE  NEGATIVE mg/dL Final  . Nitrite 54/09/811912/29/2021 NEGATIVE  NEGATIVE Final  . Glori LuisLeukocytes,Ua 03/10/2020 NEGATIVE  NEGATIVE Final   Performed at [facility], [address] [place], [place] [address]  . ABO/RH(D) 03/10/2020 O POS   Final  . Antibody Screen 03/10/2020 POS   Final  . Sample Expiration 03/10/2020 03/24/2020,2359   Final  . Extend sample reason 03/10/2020 NO TRANSFUSIONS OR PREGNANCY IN THE PAST 3 MONTHS   Final  . Antibody Identification 03/10/2020    Final                   Value:NON SPECIFIC ANTIBODY REACTIVITY Performed at [facility], [address] [place], [place] [address]     ECG: Date: 01/12/2020 Rate: 61 bpm Rhythm: Sinus rhythm with first-degree AV block; occasional PVCs Axis (leads I and aVF): Normal Intervals: PR 216 ms. QRS 74 ms. QTc 400 ms. ST segment and T wave changes: No evidence of acute ST segment elevation or depression Comparison: Similar to previous tracing obtained on 04/01/2019 NOTE: Tracing obtained at [facility]; unable for review. Above based on cardiologist's interpretation.    IMAGING / PROCEDURES: ECHOCARDIOGRAM performed on 02/04/2020 1. LVEF 50% 2. Normal left-ventricular systolic function 3. Normal right ventricular systolic function 4. Mild to moderate tricuspid and mitral valve insufficiency 5. Trace aortic valve insufficiency 6. No valvular stenosis 7. Mild right ventricular enlargement 8. Mild biatrial enlargement 9. No evidence of pericardial  effusion  LEXISCAN performed on 06/12/2016 1. LVEF 58% 2. No evidence of stress-induced myocardial ischemia or arrhythmia 3. Regional wall motion reveals normal myocardial thickening and wall motion 4. No artifacts noted 5. Left ventricular cavity size normal 6. The overall quality of study is fair   Impression and Plan:  Margit Hankslbert L He has been referred for pre-anesthesia review and clearance prior to him undergoing the planned anesthetic and procedural courses. Available labs, pertinent testing, and imaging results were personally reviewed by me. This patient has been appropriately cleared by cardiology.   Based on clinical review performed today (03/11/20), barring any significant acute changes in the patient's overall condition, it is anticipated that he will be able to proceed with the planned surgical intervention. Any acute changes in clinical condition may necessitate his procedure being postponed and/or cancelled. Pre-surgical instructions were reviewed with the patient during his PAT appointment and questions were fielded by PAT clinical staff.  Quentin MullingBryan Quaneshia Wareing, MSN, APRN, FNP-C, CEN [facility]  Peri-operative Services Nurse Practitioner Phone: [phone] 03/11/20 11:36 AM  NOTE: This note has been prepared using Dragon dictation software. Despite my best ability to proofread, there is always the potential that unintentional transcriptional errors may still occur from this process.

## 2020-03-12 LAB — URINE CULTURE
Culture: NO GROWTH
Special Requests: NORMAL

## 2020-03-17 ENCOUNTER — Other Ambulatory Visit
Admission: RE | Admit: 2020-03-17 | Discharge: 2020-03-17 | Disposition: A | Discharge status: Home / Self Care | Payer: Medicare Other | Source: Ambulatory Visit | Attending: Orthopedic Surgery | Admitting: Orthopedic Surgery

## 2020-03-17 ENCOUNTER — Other Ambulatory Visit: Payer: Self-pay

## 2020-03-17 DIAGNOSIS — Z20822 Contact with and (suspected) exposure to covid-19: Secondary | ICD-10-CM | POA: Diagnosis not present

## 2020-03-17 DIAGNOSIS — Z01812 Encounter for preprocedural laboratory examination: Secondary | ICD-10-CM | POA: Diagnosis present

## 2020-03-18 LAB — SARS CORONAVIRUS 2 (TAT 6-24 HRS): SARS Coronavirus 2: NEGATIVE

## 2020-03-18 NOTE — H&P (Signed)
ORTHOPAEDIC HISTORY & PHYSICAL Gwenlyn Fudge, [place] - 03/15/2020 3:30 PM EST Formatting of this note is different from the original. [place] Chief Complaint:   Chief Complaint  Patient presents with  . Knee Pain  H & P RIGHT KNEE   History of Present Illness:   Casey Dyer is a 75 y.o. male that presents to clinic today for his preoperative history and evaluation. Patient presents with his wife. The patient is scheduled to undergo a right knee revision from a unicompartmental knee arthroplasty to a right total knee arthroplasty on 03/19/20 by Dr. Marry Guan. Patient is status post a right medial unicompartmental knee arthroplasty in 2009 by Dr. Sherryle Lis in [place]. His pain began just over 1 year ago. The pain is located primarily along the medial aspect of the knee. He describes his pain as worse with weightbearing. He reports associated swelling. He denies associated numbness or tingling, denies locking or giving way. Patient was evaluated in the office on 02/18/2019 at which time x-ray showed evidence of loosening of the implant. Patient did not wish to proceed with revision surgery at that time. His symptoms have since worsened and he would like to proceed with revision at this time.  The patient's symptoms have progressed to the point that they decrease his quality of life.   Patient has received cardiac clearance from Dr. Nehemiah Massed. He was not seen by [facility] because he states that he was going to have an ABI performed, and he already had that test with Dr. Nehemiah Massed.  Denies history of low back surgery, history of blood clots. No significant allergies.  Patient's last A1c was 6.7.  Past Medical, Surgical, Family, Social History, Allergies, Medications:   Past Medical History:  Past Medical History:  Diagnosis Date  . Abdominal hernia  . Allergic state  . Arthritis  . CAD (coronary artery disease)  . Colon  polyp  . Diabetes mellitus without complication (CMS-HCC)  . Diverticulitis  . Diverticulitis  . ED (erectile dysfunction)  . GERD (gastroesophageal reflux disease)  . Heart disease  . HLD (hyperlipidemia)  . HTN (hypertension)  . Hyperplastic colon polyp 09/18/2016  . Sarcoidosis  . Sarcoidosis   Past Surgical History:  Past Surgical History:  Procedure Laterality Date  . Cardiac catheterization with stent placement 2007  . COLONOSCOPY 04/10/2000, 09/08/2005, 01/04/2011  . COLONOSCOPY 09/18/2016  Hyperplastic colon polyp/PHx colon polyps/FHx colon cancer-mother/Repeat 30yrs/MUS  . left knee arthroscopy with partial medial meniscectomy. Abrasion chondroplasty medial tibialcondyle. Abrasion chrondroplasty medial femoral condyle. Synovectomy with excision medial parapatellar plica 44/81/8563  Dr Marry Guan  . Left total knee arthroplasty using computer-assisted navigation 10/14/2007  Dr Marry Guan  . POLYPECTOMY  . Right medial unicompartmental knee arthroplasty 08/2003  Dr Sherryle Lis - [place]  . UMBILICAL HERNIA REPAIR   Current Medications:  Current Outpatient Medications  Medication Sig Dispense Refill  . aspirin 81 MG EC tablet Take 81 mg by mouth once daily.  . cholecalciferol (VITAMIN D3) 400 unit tablet Take 800 Units by mouth once daily  . famotidine (PEPCID) 10 MG tablet Take 10 mg by mouth once daily  . GLUCOSAMINE HCL ORAL Take 1 tablet by mouth 2 (two) times daily as needed  . lisinopriL-hydrochlorothiazide (ZESTORETIC) 20-12.5 mg tablet TAKE 2 TABLETS BY MOUTH ONCE DAILY 180 tablet 0  . melatonin 5 mg Tab Take 5 mg by mouth nightly as needed  . metFORMIN (GLUCOPHAGE-XR) 750 MG XR tablet TAKE 1 TABLET(750 MG) BY  MOUTH DAILY WITH DINNER 30 tablet 5  . metoprolol succinate (TOPROL-XL) 25 MG XL tablet TAKE 1 TABLET(25 MG) BY MOUTH EVERY DAY 90 tablet 1  . omega-3 fatty acids-vitamin E (FISH OIL) 1,000 mg Take 1 capsule by mouth once daily  . simvastatin (ZOCOR) 40 MG tablet  TAKE 1 TABLET(40 MG) BY MOUTH EVERY NIGHT 90 tablet 1  . tadalafiL (CIALIS) 20 MG tablet TAKE 1/2 TO 1 TABLET BY MOUTH AS NEEDED 8 tablet 0   No current facility-administered medications for this visit.   Allergies: No Known Allergies  Social History:  Social History   Socioeconomic History  . Marital status: Married  Spouse name: Vernona Rieger  . Number of children: 2  . Years of education: 73  . Highest education level: High school graduate  Occupational History  . Occupation: Retired  Tobacco Use  . Smoking status: Never Smoker  . Smokeless tobacco: Former Engineer, water and Sexual Activity  . Alcohol use: Yes  Alcohol/week: 7.0 standard drinks  Types: 7 Glasses of wine per week  Comment: Daily wine with ice  . Drug use: No  . Sexual activity: Yes  Partners: Female  Other Topics Concern  . Not on file  Social History Narrative  Exercise: 5-6 times a week, an hour each time, usually walking.  Diet: Red meat 3 times a week. Fast foods rarely. Fried foods rarely.   Social Determinants of Health   Financial Resource Strain: Not on file  Food Insecurity: Not on file  Transportation Needs: Not on file  Physical Activity: Not on file  Stress: Not on file  Social Connections: Not on file  Housing Stability: Not on file   Family History:  Family History  Problem Relation Age of Onset  . Colon cancer Mother 38  . Mental illness Mother  . Stroke Mother  . Diabetes type II Mother  . Colon polyps Mother  . Diabetes type II Brother  . Arthritis Father  . Cerebral aneurysm Father  . No Known Problems Sister   Review of Systems:   A 10+ ROS was performed, reviewed, and the pertinent orthopaedic findings are documented in the HPI.   Physical Examination:   BP 140/70  Ht 180.3 cm (5\' 11" )  Wt 90.6 kg (199 lb 12.8 oz)  BMI 27.87 kg/m   Patient is a well-developed, well-nourished male in no acute distress. Patient has normal mood and affect. Patient is alert and oriented  to person, place, and time.   HEENT: Atraumatic, normocephalic. Pupils equal and reactive to light. Extraocular motion intact. Noninjected sclera.  Cardiovascular: Regular rate and rhythm, with no murmurs, rubs, or gallops. No bruits. Distal pulses auscultated with doppler.  Respiratory: Lungs clear to auscultation bilaterally.   Right Knee: Soft tissue swelling: mild Effusion: minimal Erythema: none Crepitance: mild Tenderness: medial Alignment: relative varus Mediolateral laxity: medial pseudolaxity Posterior sag: negative Patellar tracking: Good tracking without evidence of subluxation or tilt Atrophy: No significant atrophy.  Quadriceps tone was fair to good. Range of motion: 0/0/112 degrees  Sensation intact over the saphenous, lateral sural cutaneous, superficial fibular, and deep fibular nerve distributions.  Tests Performed/Reviewed:  X-rays  Anteroposterior, lateral, and sunrise views of the right knee were obtained. Images reveal a unicompartmental medial compartment arthroplasty with significant lucency noted. Lateral compartment is relatively well-preserved. Patellofemoral compartment shows loss of joint space laterally. No fractures or dislocations.  I personally ordered and interpreted today's radiographs.  Impression:   ICD-10-CM  1. Loosening of knee joint  prosthesis, subsequent encounter T84.038D  Z96.659  2. Type 2 diabetes mellitus without complication, without long-term current use of insulin (CMS-HCC) E11.9   Plan:   The patient has significant evidence of loosening of a right medial compartment unicompartmental knee arthroplasty. Patient requires a revision to a total knee arthroplasty. The patient will undergo a revision to total joint arthroplasty with Dr. Ernest Pine. The risks of surgery, including blood clot and infection, were discussed with the patient. Measures to reduce these risks, including the use of anticoagulation, perioperative antibiotics, and  early ambulation were discussed. The importance of postoperative physical therapy was discussed with the patient. The patient elects to proceed with surgery. The patient is instructed to stop all blood thinners prior to surgery. The patient is instructed to call the hospital the day before surgery to learn of the proper arrival time.   Contact our office with any questions or concerns. Follow up as indicated, or sooner should any new problems arise, if conditions worsen, or if they are otherwise concerned.   Michelene Gardener, PA-C [facility] [address] [place], [place] [address] Phone: [phone]  This note was generated in part with voice recognition software and I apologize for any typographical errors that were not detected and corrected.   Electronically signed by Michelene Gardener, PA at 03/15/2020 6:06 PM EST

## 2020-03-19 ENCOUNTER — Inpatient Hospital Stay: Payer: Medicare Other

## 2020-03-19 ENCOUNTER — Encounter: Payer: Self-pay | Admitting: Orthopedic Surgery

## 2020-03-19 ENCOUNTER — Inpatient Hospital Stay: Payer: Medicare Other | Admitting: Urgent Care

## 2020-03-19 ENCOUNTER — Inpatient Hospital Stay
Admission: RE | Admit: 2020-03-19 | Discharge: 2020-03-20 | DRG: 468 | Disposition: A | Discharge status: Home / Self Care | Payer: Medicare Other | Attending: Orthopedic Surgery | Admitting: Orthopedic Surgery

## 2020-03-19 ENCOUNTER — Other Ambulatory Visit: Payer: Self-pay

## 2020-03-19 ENCOUNTER — Encounter
Admission: RE | Disposition: A | Discharge status: Home / Self Care | Payer: Self-pay | Source: Home / Self Care | Attending: Orthopedic Surgery

## 2020-03-19 DIAGNOSIS — Z833 Family history of diabetes mellitus: Secondary | ICD-10-CM | POA: Diagnosis not present

## 2020-03-19 DIAGNOSIS — Z79899 Other long term (current) drug therapy: Secondary | ICD-10-CM | POA: Diagnosis not present

## 2020-03-19 DIAGNOSIS — Z96652 Presence of left artificial knee joint: Secondary | ICD-10-CM | POA: Diagnosis present

## 2020-03-19 DIAGNOSIS — Z8371 Family history of colonic polyps: Secondary | ICD-10-CM

## 2020-03-19 DIAGNOSIS — Z8261 Family history of arthritis: Secondary | ICD-10-CM

## 2020-03-19 DIAGNOSIS — Z8719 Personal history of other diseases of the digestive system: Secondary | ICD-10-CM | POA: Diagnosis not present

## 2020-03-19 DIAGNOSIS — M1990 Unspecified osteoarthritis, unspecified site: Secondary | ICD-10-CM | POA: Diagnosis present

## 2020-03-19 DIAGNOSIS — R339 Retention of urine, unspecified: Secondary | ICD-10-CM | POA: Diagnosis not present

## 2020-03-19 DIAGNOSIS — Z823 Family history of stroke: Secondary | ICD-10-CM

## 2020-03-19 DIAGNOSIS — I2510 Atherosclerotic heart disease of native coronary artery without angina pectoris: Secondary | ICD-10-CM | POA: Insufficient documentation

## 2020-03-19 DIAGNOSIS — I70209 Unspecified atherosclerosis of native arteries of extremities, unspecified extremity: Secondary | ICD-10-CM | POA: Diagnosis present

## 2020-03-19 DIAGNOSIS — I10 Essential (primary) hypertension: Secondary | ICD-10-CM | POA: Insufficient documentation

## 2020-03-19 DIAGNOSIS — T84032A Mechanical loosening of internal right knee prosthetic joint, initial encounter: Secondary | ICD-10-CM | POA: Diagnosis present

## 2020-03-19 DIAGNOSIS — Z7984 Long term (current) use of oral hypoglycemic drugs: Secondary | ICD-10-CM

## 2020-03-19 DIAGNOSIS — Z800 Family history of malignant neoplasm of digestive organs: Secondary | ICD-10-CM | POA: Diagnosis not present

## 2020-03-19 DIAGNOSIS — K469 Unspecified abdominal hernia without obstruction or gangrene: Secondary | ICD-10-CM | POA: Diagnosis present

## 2020-03-19 DIAGNOSIS — E1151 Type 2 diabetes mellitus with diabetic peripheral angiopathy without gangrene: Secondary | ICD-10-CM | POA: Diagnosis present

## 2020-03-19 DIAGNOSIS — Z7982 Long term (current) use of aspirin: Secondary | ICD-10-CM | POA: Diagnosis not present

## 2020-03-19 DIAGNOSIS — Z87891 Personal history of nicotine dependence: Secondary | ICD-10-CM | POA: Diagnosis not present

## 2020-03-19 DIAGNOSIS — Z818 Family history of other mental and behavioral disorders: Secondary | ICD-10-CM | POA: Diagnosis not present

## 2020-03-19 DIAGNOSIS — Z96659 Presence of unspecified artificial knee joint: Secondary | ICD-10-CM | POA: Diagnosis present

## 2020-03-19 DIAGNOSIS — E782 Mixed hyperlipidemia: Secondary | ICD-10-CM | POA: Insufficient documentation

## 2020-03-19 HISTORY — DX: Type 2 diabetes mellitus without complications: E11.9

## 2020-03-19 HISTORY — DX: Sarcoidosis, unspecified: D86.9

## 2020-03-19 HISTORY — PX: TOTAL KNEE REVISION: SHX996

## 2020-03-19 HISTORY — DX: Unspecified atherosclerosis of native arteries of extremities, unspecified extremity: I70.209

## 2020-03-19 LAB — BPAM RBC
Blood Product Expiration Date: 202202042359
Blood Product Expiration Date: 202202042359
Unit Type and Rh: 5100
Unit Type and Rh: 5100

## 2020-03-19 LAB — TYPE AND SCREEN
ABO/RH(D): O POS
Antibody Screen: POSITIVE
Unit division: 0
Unit division: 0

## 2020-03-19 LAB — GRAM STAIN: Gram Stain: NONE SEEN

## 2020-03-19 LAB — GLUCOSE, CAPILLARY
Glucose-Capillary: 129 mg/dL — ABNORMAL HIGH (ref 70–99)
Glucose-Capillary: 180 mg/dL — ABNORMAL HIGH (ref 70–99)
Glucose-Capillary: 181 mg/dL — ABNORMAL HIGH (ref 70–99)
Glucose-Capillary: 153 mg/dL — ABNORMAL HIGH (ref 70–99)

## 2020-03-19 LAB — ABO/RH: ABO/RH(D): O POS

## 2020-03-19 SURGERY — TOTAL KNEE REVISION
Anesthesia: Spinal | Site: Knee | Laterality: Right

## 2020-03-19 MED ORDER — GABAPENTIN 300 MG PO CAPS: 300.0000 mg | ORAL_CAPSULE | Freq: Once | ORAL | Status: AC

## 2020-03-19 MED ORDER — CEFAZOLIN SODIUM-DEXTROSE 2-3 GM-%(50ML) IV SOLR
INTRAVENOUS | Status: DC | PRN
  Administered 2020-03-19: 2 g via INTRAVENOUS

## 2020-03-19 MED ORDER — INSULIN ASPART 100 UNIT/ML ~~LOC~~ SOLN: 0.0000 [IU] | Freq: Three times a day (TID) | SUBCUTANEOUS | Status: DC

## 2020-03-19 MED ORDER — CHLORHEXIDINE GLUCONATE 0.12 % MT SOLN
OROMUCOSAL | Status: AC
  Administered 2020-03-19: 15 mL via OROMUCOSAL
  Filled 2020-03-19: qty 15

## 2020-03-19 MED ORDER — METOPROLOL SUCCINATE ER 25 MG PO TB24
25.0000 mg | ORAL_TABLET | Freq: Every day | ORAL | Status: DC
  Administered 2020-03-19 – 2020-03-20 (×2): 25 mg via ORAL
  Filled 2020-03-19 (×2): qty 1

## 2020-03-19 MED ORDER — DEXMEDETOMIDINE (PRECEDEX) IN NS 20 MCG/5ML (4 MCG/ML) IV SYRINGE
PREFILLED_SYRINGE | INTRAVENOUS | Status: AC
  Filled 2020-03-19: qty 5

## 2020-03-19 MED ORDER — ONDANSETRON HCL 4 MG PO TABS: 4.0000 mg | ORAL_TABLET | Freq: Four times a day (QID) | ORAL | Status: DC | PRN

## 2020-03-19 MED ORDER — GLYCOPYRROLATE 0.2 MG/ML IJ SOLN
INTRAMUSCULAR | Status: DC | PRN
  Administered 2020-03-19: .2 mg via INTRAVENOUS

## 2020-03-19 MED ORDER — PROPOFOL 10 MG/ML IV BOLUS
INTRAVENOUS | Status: DC | PRN
  Administered 2020-03-19: 40 mg via INTRAVENOUS

## 2020-03-19 MED ORDER — CEFAZOLIN SODIUM-DEXTROSE 2-4 GM/100ML-% IV SOLN
2.0000 g | INTRAVENOUS | Status: AC
  Administered 2020-03-19: 2 g via INTRAVENOUS

## 2020-03-19 MED ORDER — NEOMYCIN-POLYMYXIN B GU 40-200000 IR SOLN
Status: DC | PRN
  Administered 2020-03-19: 2 mL

## 2020-03-19 MED ORDER — TRANEXAMIC ACID-NACL 1000-0.7 MG/100ML-% IV SOLN
INTRAVENOUS | Status: AC
  Administered 2020-03-19: 1000 mg via INTRAVENOUS
  Filled 2020-03-19: qty 100

## 2020-03-19 MED ORDER — DIPHENHYDRAMINE HCL 12.5 MG/5ML PO ELIX
12.5000 mg | ORAL_SOLUTION | ORAL | Status: DC | PRN
  Filled 2020-03-19: qty 10

## 2020-03-19 MED ORDER — TRANEXAMIC ACID-NACL 1000-0.7 MG/100ML-% IV SOLN: 1000.0000 mg | Freq: Once | INTRAVENOUS | Status: AC

## 2020-03-19 MED ORDER — OMEGA-3-ACID ETHYL ESTERS 1 G PO CAPS
1.0000 g | ORAL_CAPSULE | Freq: Every day | ORAL | Status: DC
  Administered 2020-03-19 – 2020-03-20 (×2): 1 g via ORAL
  Filled 2020-03-19 (×2): qty 1

## 2020-03-19 MED ORDER — ACETAMINOPHEN 325 MG PO TABS: 325.0000 mg | ORAL_TABLET | Freq: Four times a day (QID) | ORAL | Status: DC | PRN

## 2020-03-19 MED ORDER — METOCLOPRAMIDE HCL 10 MG PO TABS
ORAL_TABLET | ORAL | Status: AC
  Administered 2020-03-19: 10 mg via ORAL
  Filled 2020-03-19: qty 1

## 2020-03-19 MED ORDER — TRANEXAMIC ACID-NACL 1000-0.7 MG/100ML-% IV SOLN
INTRAVENOUS | Status: DC | PRN
  Administered 2020-03-19: 1000 mg via INTRAVENOUS

## 2020-03-19 MED ORDER — MAGNESIUM HYDROXIDE 400 MG/5ML PO SUSP
ORAL | Status: AC
  Administered 2020-03-19: 30 mL via ORAL
  Filled 2020-03-19: qty 30

## 2020-03-19 MED ORDER — DEXAMETHASONE SODIUM PHOSPHATE 10 MG/ML IJ SOLN
INTRAMUSCULAR | Status: AC
  Administered 2020-03-19: 8 mg via INTRAVENOUS
  Filled 2020-03-19: qty 1

## 2020-03-19 MED ORDER — CELECOXIB 200 MG PO CAPS
ORAL_CAPSULE | ORAL | Status: AC
  Administered 2020-03-19: 400 mg via ORAL
  Filled 2020-03-19: qty 2

## 2020-03-19 MED ORDER — ENSURE PRE-SURGERY PO LIQD
296.0000 mL | Freq: Once | ORAL | Status: DC
  Filled 2020-03-19: qty 296

## 2020-03-19 MED ORDER — CELECOXIB 200 MG PO CAPS: 400.0000 mg | ORAL_CAPSULE | Freq: Once | ORAL | Status: AC

## 2020-03-19 MED ORDER — FENTANYL CITRATE (PF) 100 MCG/2ML IJ SOLN
INTRAMUSCULAR | Status: DC | PRN
  Administered 2020-03-19: 25 ug via INTRAVENOUS

## 2020-03-19 MED ORDER — CELECOXIB 200 MG PO CAPS
200.0000 mg | ORAL_CAPSULE | Freq: Two times a day (BID) | ORAL | Status: DC
  Administered 2020-03-20: 200 mg via ORAL

## 2020-03-19 MED ORDER — TRAMADOL HCL 50 MG PO TABS
ORAL_TABLET | ORAL | Status: AC
  Filled 2020-03-19: qty 1

## 2020-03-19 MED ORDER — PANTOPRAZOLE SODIUM 40 MG PO TBEC
40.0000 mg | DELAYED_RELEASE_TABLET | Freq: Two times a day (BID) | ORAL | Status: DC
  Administered 2020-03-19 – 2020-03-20 (×2): 40 mg via ORAL
  Filled 2020-03-19 (×3): qty 1

## 2020-03-19 MED ORDER — VITAMIN D3 25 MCG (1000 UNIT) PO TABS
2000.0000 [IU] | ORAL_TABLET | Freq: Every day | ORAL | Status: DC
  Administered 2020-03-19 – 2020-03-20 (×2): 2000 [IU] via ORAL
  Filled 2020-03-19 (×2): qty 2

## 2020-03-19 MED ORDER — LISINOPRIL 20 MG PO TABS
20.0000 mg | ORAL_TABLET | Freq: Every day | ORAL | Status: DC
  Administered 2020-03-19 – 2020-03-20 (×2): 20 mg via ORAL
  Filled 2020-03-19 (×2): qty 1

## 2020-03-19 MED ORDER — SIMVASTATIN 40 MG PO TABS
40.0000 mg | ORAL_TABLET | Freq: Every day | ORAL | Status: DC
  Administered 2020-03-19 – 2020-03-20 (×2): 40 mg via ORAL
  Filled 2020-03-19 (×2): qty 1

## 2020-03-19 MED ORDER — ACETAMINOPHEN 10 MG/ML IV SOLN
INTRAVENOUS | Status: AC
  Administered 2020-03-20: 1000 mg via INTRAVENOUS
  Filled 2020-03-19: qty 100

## 2020-03-19 MED ORDER — TRANEXAMIC ACID-NACL 1000-0.7 MG/100ML-% IV SOLN: 1000.0000 mg | INTRAVENOUS | Status: AC

## 2020-03-19 MED ORDER — SODIUM CHLORIDE 0.9 % IV SOLN
INTRAVENOUS | Status: DC | PRN
  Administered 2020-03-19: 60 mL

## 2020-03-19 MED ORDER — MENTHOL 3 MG MT LOZG
1.0000 | LOZENGE | OROMUCOSAL | Status: DC | PRN
  Filled 2020-03-19: qty 9

## 2020-03-19 MED ORDER — PROPOFOL 10 MG/ML IV BOLUS
INTRAVENOUS | Status: AC
  Filled 2020-03-19: qty 20

## 2020-03-19 MED ORDER — ONDANSETRON HCL 4 MG/2ML IJ SOLN
INTRAMUSCULAR | Status: AC
  Filled 2020-03-19: qty 2

## 2020-03-19 MED ORDER — PROPOFOL 500 MG/50ML IV EMUL
INTRAVENOUS | Status: AC
  Filled 2020-03-19: qty 50

## 2020-03-19 MED ORDER — LISINOPRIL-HYDROCHLOROTHIAZIDE 20-12.5 MG PO TABS: 1.0000 | ORAL_TABLET | Freq: Every day | ORAL | Status: DC

## 2020-03-19 MED ORDER — SODIUM CHLORIDE 0.9 % IV SOLN: INTRAVENOUS | Status: DC

## 2020-03-19 MED ORDER — DEXMEDETOMIDINE (PRECEDEX) IN NS 20 MCG/5ML (4 MCG/ML) IV SYRINGE
PREFILLED_SYRINGE | INTRAVENOUS | Status: DC | PRN
  Administered 2020-03-19: 4 ug via INTRAVENOUS

## 2020-03-19 MED ORDER — METOCLOPRAMIDE HCL 10 MG PO TABS
10.0000 mg | ORAL_TABLET | Freq: Three times a day (TID) | ORAL | Status: DC
  Administered 2020-03-20: 10 mg via ORAL
  Filled 2020-03-19: qty 1

## 2020-03-19 MED ORDER — FENTANYL CITRATE (PF) 100 MCG/2ML IJ SOLN
INTRAMUSCULAR | Status: AC
  Filled 2020-03-19: qty 2

## 2020-03-19 MED ORDER — BUPIVACAINE HCL (PF) 0.5 % IJ SOLN
INTRAMUSCULAR | Status: AC
  Filled 2020-03-19: qty 10

## 2020-03-19 MED ORDER — ENOXAPARIN SODIUM 30 MG/0.3ML ~~LOC~~ SOLN
30.0000 mg | Freq: Two times a day (BID) | SUBCUTANEOUS | Status: DC
  Administered 2020-03-20: 30 mg via SUBCUTANEOUS
  Filled 2020-03-19 (×3): qty 0.3

## 2020-03-19 MED ORDER — TRAMADOL HCL 50 MG PO TABS
50.0000 mg | ORAL_TABLET | ORAL | Status: DC | PRN
  Administered 2020-03-19: 50 mg via ORAL

## 2020-03-19 MED ORDER — BUPIVACAINE HCL 0.25 % IJ SOLN
INTRAMUSCULAR | Status: DC | PRN
  Administered 2020-03-19: 60 mL

## 2020-03-19 MED ORDER — PHENOL 1.4 % MT LIQD
1.0000 | OROMUCOSAL | Status: DC | PRN
  Filled 2020-03-19: qty 177

## 2020-03-19 MED ORDER — OXYCODONE HCL 5 MG PO TABS
5.0000 mg | ORAL_TABLET | ORAL | Status: DC | PRN
  Administered 2020-03-20: 10 mg via ORAL

## 2020-03-19 MED ORDER — CELECOXIB 200 MG PO CAPS
ORAL_CAPSULE | ORAL | Status: AC
  Administered 2020-03-19: 200 mg via ORAL
  Filled 2020-03-19: qty 1

## 2020-03-19 MED ORDER — PHENYLEPHRINE HCL (PRESSORS) 10 MG/ML IV SOLN
INTRAVENOUS | Status: AC
  Filled 2020-03-19: qty 1

## 2020-03-19 MED ORDER — TETRACAINE HCL 1 % IJ SOLN
INTRAMUSCULAR | Status: DC | PRN
  Administered 2020-03-19: 10 mg via INTRASPINAL

## 2020-03-19 MED ORDER — ORAL CARE MOUTH RINSE: 15.0000 mL | Freq: Once | OROMUCOSAL | Status: AC

## 2020-03-19 MED ORDER — GLYCOPYRROLATE 0.2 MG/ML IJ SOLN
INTRAMUSCULAR | Status: AC
  Filled 2020-03-19: qty 1

## 2020-03-19 MED ORDER — BISACODYL 10 MG RE SUPP
10.0000 mg | Freq: Every day | RECTAL | Status: DC | PRN
  Filled 2020-03-19: qty 1

## 2020-03-19 MED ORDER — GABAPENTIN 300 MG PO CAPS
ORAL_CAPSULE | ORAL | Status: AC
  Administered 2020-03-19: 300 mg via ORAL
  Filled 2020-03-19: qty 1

## 2020-03-19 MED ORDER — ONDANSETRON HCL 4 MG/2ML IJ SOLN
INTRAMUSCULAR | Status: DC | PRN
  Administered 2020-03-19: 4 mg via INTRAVENOUS

## 2020-03-19 MED ORDER — FENTANYL CITRATE (PF) 100 MCG/2ML IJ SOLN: 25.0000 ug | INTRAMUSCULAR | Status: DC | PRN

## 2020-03-19 MED ORDER — CEFAZOLIN SODIUM-DEXTROSE 2-4 GM/100ML-% IV SOLN
INTRAVENOUS | Status: AC
  Filled 2020-03-19: qty 100

## 2020-03-19 MED ORDER — CEFAZOLIN SODIUM-DEXTROSE 2-4 GM/100ML-% IV SOLN: 2.0000 g | Freq: Four times a day (QID) | INTRAVENOUS | Status: AC

## 2020-03-19 MED ORDER — HYDROCHLOROTHIAZIDE 12.5 MG PO CAPS
12.5000 mg | ORAL_CAPSULE | Freq: Every day | ORAL | Status: DC
  Administered 2020-03-19 – 2020-03-20 (×2): 12.5 mg via ORAL
  Filled 2020-03-19 (×2): qty 1

## 2020-03-19 MED ORDER — FLEET ENEMA 7-19 GM/118ML RE ENEM: 1.0000 | ENEMA | Freq: Once | RECTAL | Status: DC | PRN

## 2020-03-19 MED ORDER — MAGNESIUM HYDROXIDE 400 MG/5ML PO SUSP
30.0000 mL | Freq: Every day | ORAL | Status: DC
  Administered 2020-03-20: 30 mL via ORAL
  Filled 2020-03-19: qty 30

## 2020-03-19 MED ORDER — ONDANSETRON HCL 4 MG/2ML IJ SOLN: 4.0000 mg | Freq: Once | INTRAMUSCULAR | Status: DC | PRN

## 2020-03-19 MED ORDER — METFORMIN HCL ER 750 MG PO TB24
750.0000 mg | ORAL_TABLET | Freq: Every day | ORAL | Status: DC
Start: 2020-03-19 — End: 2020-03-20
  Administered 2020-03-19 – 2020-03-20 (×2): 750 mg via ORAL
  Filled 2020-03-19 (×2): qty 1

## 2020-03-19 MED ORDER — PROPOFOL 500 MG/50ML IV EMUL
INTRAVENOUS | Status: DC | PRN
  Administered 2020-03-19: 70 ug/kg/min via INTRAVENOUS

## 2020-03-19 MED ORDER — CEFAZOLIN SODIUM-DEXTROSE 2-4 GM/100ML-% IV SOLN
INTRAVENOUS | Status: AC
  Administered 2020-03-19: 2 g via INTRAVENOUS
  Filled 2020-03-19: qty 100

## 2020-03-19 MED ORDER — HYDROMORPHONE HCL 1 MG/ML IJ SOLN: 0.5000 mg | INTRAMUSCULAR | Status: DC | PRN

## 2020-03-19 MED ORDER — CHLORHEXIDINE GLUCONATE 0.12 % MT SOLN: 15.0000 mL | Freq: Once | OROMUCOSAL | Status: AC

## 2020-03-19 MED ORDER — SENNOSIDES-DOCUSATE SODIUM 8.6-50 MG PO TABS
1.0000 | ORAL_TABLET | Freq: Two times a day (BID) | ORAL | Status: DC
  Administered 2020-03-19 – 2020-03-20 (×2): 1 via ORAL
  Filled 2020-03-19 (×4): qty 1

## 2020-03-19 MED ORDER — PHENYLEPHRINE HCL (PRESSORS) 10 MG/ML IV SOLN
INTRAVENOUS | Status: DC | PRN
  Administered 2020-03-19 (×2): 100 ug via INTRAVENOUS

## 2020-03-19 MED ORDER — DEXAMETHASONE SODIUM PHOSPHATE 10 MG/ML IJ SOLN: 8.0000 mg | Freq: Once | INTRAMUSCULAR | Status: AC

## 2020-03-19 MED ORDER — ACETAMINOPHEN 10 MG/ML IV SOLN
INTRAVENOUS | Status: AC
  Filled 2020-03-19: qty 100

## 2020-03-19 MED ORDER — FERROUS SULFATE 325 (65 FE) MG PO TABS
325.0000 mg | ORAL_TABLET | Freq: Two times a day (BID) | ORAL | Status: DC
  Administered 2020-03-19 – 2020-03-20 (×2): 325 mg via ORAL
  Filled 2020-03-19 (×4): qty 1

## 2020-03-19 MED ORDER — ACETAMINOPHEN 10 MG/ML IV SOLN
INTRAVENOUS | Status: AC
  Administered 2020-03-19: 1000 mg via INTRAVENOUS
  Filled 2020-03-19: qty 100

## 2020-03-19 MED ORDER — ALUM & MAG HYDROXIDE-SIMETH 200-200-20 MG/5ML PO SUSP: 30.0000 mL | ORAL | Status: DC | PRN

## 2020-03-19 MED ORDER — BUPIVACAINE HCL (PF) 0.5 % IJ SOLN
INTRAMUSCULAR | Status: DC | PRN
  Administered 2020-03-19: 2 mL

## 2020-03-19 MED ORDER — GABAPENTIN 300 MG PO CAPS: 300.0000 mg | ORAL_CAPSULE | Freq: Every day | ORAL | Status: DC

## 2020-03-19 MED ORDER — CEFAZOLIN SODIUM-DEXTROSE 2-4 GM/100ML-% IV SOLN
INTRAVENOUS | Status: AC
  Administered 2020-03-20: 2 g via INTRAVENOUS
  Filled 2020-03-19: qty 100

## 2020-03-19 MED ORDER — ONDANSETRON HCL 4 MG/2ML IJ SOLN: 4.0000 mg | Freq: Four times a day (QID) | INTRAMUSCULAR | Status: DC | PRN

## 2020-03-19 MED ORDER — ACETAMINOPHEN 10 MG/ML IV SOLN: 1000.0000 mg | Freq: Four times a day (QID) | INTRAVENOUS | Status: DC

## 2020-03-19 MED ORDER — CHLORHEXIDINE GLUCONATE 4 % EX LIQD: 60.0000 mL | Freq: Once | CUTANEOUS | Status: DC

## 2020-03-19 MED ORDER — LIDOCAINE HCL (PF) 2 % IJ SOLN
INTRAMUSCULAR | Status: AC
  Filled 2020-03-19: qty 5

## 2020-03-19 MED ORDER — ACETAMINOPHEN 10 MG/ML IV SOLN
INTRAVENOUS | Status: DC | PRN
  Administered 2020-03-19: 1000 mg via INTRAVENOUS

## 2020-03-19 MED ORDER — SODIUM CHLORIDE 0.9 % IV SOLN
INTRAVENOUS | Status: DC | PRN
  Administered 2020-03-19: 30 ug/min via INTRAVENOUS

## 2020-03-19 SURGICAL SUPPLY — 77 items
ATTUNE MED DOME PAT 41 KNEE (Knees) ×1 IMPLANT
ATTUNE PS FEM RT SZ 6 CEM KNEE (Femur) ×1 IMPLANT
BASE TIBIAL ROT PLAT SZ 7 KNEE (Knees) IMPLANT
BLADE OSCILLATING/SAGITTAL (BLADE) ×1
BLADE SAGITTAL 25.0X1.19X90 (BLADE) ×2 IMPLANT
BLADE SAW 1/2 (BLADE) ×2 IMPLANT
BLADE SW THK.38XMED LNG THN (BLADE) ×1 IMPLANT
BNDG ESMARK 6X12 TAN STRL LF (GAUZE/BANDAGES/DRESSINGS) ×1 IMPLANT
CANISTER SUCT 1200ML W/VALVE (MISCELLANEOUS) ×2 IMPLANT
CANISTER SUCT 3000ML PPV (MISCELLANEOUS) ×2 IMPLANT
CEMENT HV SMART SET (Cement) ×4 IMPLANT
CNTNR SPEC 2.5X3XGRAD LEK (MISCELLANEOUS) ×1
CONT SPEC 4OZ STER OR WHT (MISCELLANEOUS) ×1
CONTAINER SPEC 2.5X3XGRAD LEK (MISCELLANEOUS) ×1 IMPLANT
COOLER POLAR GLACIER W/PUMP (MISCELLANEOUS) ×2 IMPLANT
COVER BACK TABLE REUSABLE LG (DRAPES) ×2 IMPLANT
COVER WAND RF STERILE (DRAPES) ×2 IMPLANT
CUFF TOURN SGL QUICK 24 (TOURNIQUET CUFF)
CUFF TOURN SGL QUICK 30 (TOURNIQUET CUFF)
CUFF TRNQT CYL 24X4X16.5-23 (TOURNIQUET CUFF) IMPLANT
CUFF TRNQT CYL 30X4X21-28X (TOURNIQUET CUFF) IMPLANT
DRAPE 3/4 80X56 (DRAPES) ×4 IMPLANT
DRSG DERMACEA 8X12 NADH (GAUZE/BANDAGES/DRESSINGS) ×2 IMPLANT
DRSG MEPILEX SACRM 8.7X9.8 (GAUZE/BANDAGES/DRESSINGS) ×2 IMPLANT
DRSG OPSITE POSTOP 4X14 (GAUZE/BANDAGES/DRESSINGS) ×2 IMPLANT
DURAPREP 26ML APPLICATOR (WOUND CARE) ×2 IMPLANT
ELECT REM PT RETURN 9FT ADLT (ELECTROSURGICAL) ×2
ELECTRODE REM PT RTRN 9FT ADLT (ELECTROSURGICAL) ×1 IMPLANT
GAUZE SPONGE 4X4 12PLY STRL (GAUZE/BANDAGES/DRESSINGS) ×2 IMPLANT
GLOVE INDICATOR 8.0 STRL GRN (GLOVE) ×2 IMPLANT
GLOVE SURG ENC TEXT LTX SZ7.5 (GLOVE) ×2 IMPLANT
GOWN STRL REUS W/ TWL LRG LVL3 (GOWN DISPOSABLE) ×2 IMPLANT
GOWN STRL REUS W/TWL LRG LVL3 (GOWN DISPOSABLE) ×2
HANDPIECE VERSAJET DEBRIDEMENT (MISCELLANEOUS) ×2 IMPLANT
HEMOVAC 400CC 10FR (MISCELLANEOUS) ×2 IMPLANT
HOOD PEEL AWAY FLYTE STAYCOOL (MISCELLANEOUS) ×4 IMPLANT
INSERT KNEE ATTUNE SZ6 14MM (Insert) ×1 IMPLANT
IRRIGATION STRYKERFLOW (MISCELLANEOUS) ×1 IMPLANT
IRRIGATOR STRYKERFLOW (MISCELLANEOUS) ×2
KIT TURNOVER KIT A (KITS) ×2 IMPLANT
KNIFE SCULPS 14X20 (INSTRUMENTS) ×2 IMPLANT
MANIFOLD NEPTUNE II (INSTRUMENTS) ×4 IMPLANT
NDL SAFETY ECLIPSE 18X1.5 (NEEDLE) ×1 IMPLANT
NDL SPNL 18GX3.5 QUINCKE PK (NEEDLE) ×1 IMPLANT
NDL SPNL 20GX3.5 QUINCKE YW (NEEDLE) ×2 IMPLANT
NEEDLE HYPO 18GX1.5 SHARP (NEEDLE) ×1
NEEDLE SPNL 18GX3.5 QUINCKE PK (NEEDLE) ×2 IMPLANT
NEEDLE SPNL 20GX3.5 QUINCKE YW (NEEDLE) ×4 IMPLANT
NS IRRIG 1000ML POUR BTL (IV SOLUTION) ×2 IMPLANT
PACK TOTAL KNEE (MISCELLANEOUS) ×2 IMPLANT
PAD ABD DERMACEA PRESS 5X9 (GAUZE/BANDAGES/DRESSINGS) ×2 IMPLANT
PAD WRAPON POLAR KNEE (MISCELLANEOUS) ×1 IMPLANT
PENCIL SMOKE ULTRAEVAC 22 CON (MISCELLANEOUS) ×2 IMPLANT
PULSAVAC PLUS IRRIG FAN TIP (DISPOSABLE) ×2
SOL .9 NS 3000ML IRR  AL (IV SOLUTION) ×1
SOL .9 NS 3000ML IRR UROMATIC (IV SOLUTION) ×1 IMPLANT
SPONGE LAP 18X18 RF (DISPOSABLE) ×2 IMPLANT
STAPLER SKIN PROX 35W (STAPLE) ×2 IMPLANT
SUCTION FRAZIER HANDLE 10FR (MISCELLANEOUS) ×1
SUCTION TUBE FRAZIER 10FR DISP (MISCELLANEOUS) ×1 IMPLANT
SUT MNCRL 0 1X36 CT-1 (SUTURE) ×1 IMPLANT
SUT MON AB 2-0 CT1 36 (SUTURE) ×2 IMPLANT
SUT MONOCRYL 0 (SUTURE) ×1
SUT PROLENE 1 CT 1 30 (SUTURE) ×2 IMPLANT
SUT VIC AB 0 CT1 36 (SUTURE) ×2 IMPLANT
SUT VIC AB 1 CT1 36 (SUTURE) ×4 IMPLANT
SUT VIC AB 2-0 CT1 (SUTURE) ×2 IMPLANT
SWAB CULTURE AMIES ANAERIB BLU (MISCELLANEOUS) ×14 IMPLANT
SYR 20ML LL LF (SYRINGE) ×2 IMPLANT
SYR 30ML LL (SYRINGE) ×4 IMPLANT
TIBIAL BASE ROT PLAT SZ 7 KNEE (Knees) ×2 IMPLANT
TIP BRUSH PULSAVAC PLUS 24.33 (MISCELLANEOUS) ×2 IMPLANT
TIP FAN IRRIG PULSAVAC PLUS (DISPOSABLE) ×1 IMPLANT
TOWEL OR 17X26 4PK STRL BLUE (TOWEL DISPOSABLE) ×2 IMPLANT
TOWER CARTRIDGE SMART MIX (DISPOSABLE) ×2 IMPLANT
TRAY FOLEY MTR SLVR 16FR STAT (SET/KITS/TRAYS/PACK) ×2 IMPLANT
WRAPON POLAR PAD KNEE (MISCELLANEOUS) ×2

## 2020-03-19 NOTE — Anesthesia Preprocedure Evaluation (Addendum)
Anesthesia Evaluation  Patient identified by MRN, date of birth, ID band Patient awake    Reviewed: Allergy & Precautions, NPO status , Patient's Chart, lab work & pertinent test results  History of Anesthesia Complications Negative for: history of anesthetic complications  Airway Mallampati: III       Dental   Pulmonary neg sleep apnea, neg COPD, Not current smoker, former smoker,           Cardiovascular hypertension, Pt. on medications + Past MI and + Cardiac Stents  (-) CHF (-) dysrhythmias (-) Valvular Problems/Murmurs     Neuro/Psych neg Seizures    GI/Hepatic Neg liver ROS, GERD  Medicated and Controlled,  Endo/Other  diabetes, Type 2, Oral Hypoglycemic Agents  Renal/GU negative Renal ROS     Musculoskeletal   Abdominal   Peds  Hematology   Anesthesia Other Findings   Reproductive/Obstetrics                            Anesthesia Physical Anesthesia Plan  ASA: III  Anesthesia Plan: Spinal   Post-op Pain Management:    Induction:   PONV Risk Score and Plan:   Airway Management Planned:   Additional Equipment:   Intra-op Plan:   Post-operative Plan:   Informed Consent: I have reviewed the patients History and Physical, chart, labs and discussed the procedure including the risks, benefits and alternatives for the proposed anesthesia with the patient or authorized representative who has indicated his/her understanding and acceptance.       Plan Discussed with:   Anesthesia Plan Comments:         Anesthesia Quick Evaluation

## 2020-03-19 NOTE — Plan of Care (Signed)
Pt progressing toward goals.

## 2020-03-19 NOTE — Anesthesia Procedure Notes (Signed)
Spinal  Patient location during procedure: OR Start time: 03/19/2020 7:30 AM End time: 03/19/2020 7:36 AM Staffing Performed: resident/CRNA  Anesthesiologist: Gunnar Fusi, MD Resident/CRNA: Hedda Slade, CRNA Preanesthetic Checklist Completed: patient identified, IV checked, site marked, risks and benefits discussed, surgical consent, monitors and equipment checked, pre-op evaluation and timeout performed Spinal Block Patient position: sitting Prep: ChloraPrep Patient monitoring: heart rate, continuous pulse ox, blood pressure and cardiac monitor Approach: midline Location: L3-4 Injection technique: single-shot Needle Needle type: Whitacre and Introducer  Needle gauge: 24 G Needle length: 9 cm Additional Notes Negative paresthesia. Negative blood return. Positive free-flowing CSF. Expiration date of kit checked and confirmed. Patient tolerated procedure well, without complications.

## 2020-03-19 NOTE — Transfer of Care (Signed)
Immediate Anesthesia Transfer of Care Note  Patient: Casey Dyer  Procedure(s) Performed: RIGHT KNEE REVISION FROM UNI TO A TOTAL (Right Knee)  Patient Location: PACU  Anesthesia Type:Spinal  Level of Consciousness: awake, alert  and oriented  Airway & Oxygen Therapy: Patient Spontanous Breathing and Patient connected to face mask oxygen  Post-op Assessment: Report given to RN and Post -op Vital signs reviewed and stable  Post vital signs: Reviewed and stable  Last Vitals:  Vitals Value Taken Time  BP 99/64 03/19/20 1258  Temp 37.2 C 03/19/20 1258  Pulse 71 03/19/20 1301  Resp 9 03/19/20 1301  SpO2 99 % 03/19/20 1301  Vitals shown include unvalidated device data.  Last Pain:  Vitals:   03/19/20 0630  TempSrc: Temporal  PainSc: 0-No pain         Complications: No complications documented.

## 2020-03-19 NOTE — Op Note (Signed)
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 M.D.  ASSISTANT: Baldwin Jamaica, PA-C (present and scrubbed throughout the case, critical for assistance with exposure, retraction, instrumentation, and closure)  ANESTHESIA: spinal  ESTIMATED BLOOD LOSS: 50 mL  FLUIDS REPLACED: 1500 mL of crystalloid  TOURNIQUET TIME: #1 - 150 minutes #2 - 18 minutes  DRAINS: 2 medium Hemovac drains  SOFT TISSUE RELEASES: Anterior cruciate ligament, posterior cruciate ligament, deep medial collateral ligament, patellofemoral ligament  IMPLANTS UTILIZED: DePuy Attune size 6 posterior stabilized femoral component (cemented), size 7 rotating platform tibial component (cemented), 41 mm medialized dome patella (cemented), and a 14 mm stabilized rotating platform polyethylene insert.  INDICATIONS FOR SURGERY: Casey Dyer is a 75 y.o. year old male who underwent right medial unicompartmental knee arthroplasty over 15 years ago.  He was having increased pain and swelling to the knee and x-rays demonstrated findings consistent with implant loosening.  After discussion of the risks and benefits of surgical intervention, the patient expressed understanding of the risks benefits and agree with plans for conversion of unicompartmental knee arthroplasty to total knee arthroplasty.   The risks, benefits, and alternatives were discussed at length including but not limited to the risks of infection, bleeding, nerve injury, stiffness, blood clots, the need for revision surgery, cardiopulmonary complications, among others, and they were willing  to proceed.  PROCEDURE IN DETAIL: The patient was brought into the operating room and, after adequate spinal anesthesia was achieved, a tourniquet was placed on the patient's upper thigh. The patient's knee and leg were cleaned and prepped with alcohol and DuraPrep and draped in the usual sterile fashion. A "timeout" was performed as per usual protocol. The lower extremity was exsanguinated using an Esmarch, and the tourniquet was inflated to 300 mmHg. An anterior longitudinal incision was made followed by a standard medial parapatellar approach.  Swabs were obtained of the synovial fluid and submitted for stat Gram stain and cultures.  The deep fibers of the medial collateral ligament were elevated in a subperiosteal fashion off of the medial flare of the tibia so as to maintain a continuous soft tissue sleeve. The patella was subluxed laterally and the patellofemoral ligament was incised.  Hypertrophied synovium was noted and an extensive synovectomy was performed involving the medial and lateral gutters and the suprapatellar pouch.  There was significant wear of the polyethylene component and the femoral component was grossly loose.. Osteophytes were debrided using a rongeur. Anterior and posterior cruciate ligaments were excised. Two 4.0 mm Schanz pins were inserted in the femur and into the tibia for attachment of the array of trackers used for computer-assisted navigation. Hip center was identified using a circumduction technique. Distal landmarks were mapped using the computer. The distal femur and proximal tibia were mapped (with the unicompartmental implants in place) using the computer.  The femoral implant was easily removed and prominent cysts were debrided beneath the implant using curettes and rongeurs.  The circumference of the tibial tray was debrided of soft tissue using curettes.  A TPS saw was then used to broach the interface between the implant and the bone cement.  The tibial tray was removed  without difficulty.  The  distal femoral cutting guide was positioned using computer-assisted navigation so as to achieve a 5 distal valgus cut. The femur was sized and it was felt that a size 6 femoral component was appropriate. A size 6 femoral cutting guide was positioned and the anterior cut was performed and verified using the computer. This was followed by completion of the posterior and chamfer cuts. Femoral cutting guide for the central box was then positioned in the center box cut was performed.  Attention was then directed to the proximal tibia.  The medial meniscus was absent.  The lateral meniscus was excised.  The extramedullary tibial cutting guide was positioned using computer-assisted navigation so as to achieve a 0 varus-valgus alignment and 3 posterior slope. The cut was performed and verified using the computer. The proximal tibia was sized and it was felt that a size 7 tibial tray was appropriate. Tibial and femoral trials were inserted followed by insertion of a 14 mm polyethylene insert. This allowed for excellent mediolateral soft tissue balancing both in flexion and in full extension. Finally, the patella was cut and prepared so as to accommodate a 41 mm medialized dome patella.  The tourniquet was deflated after initial tourniquet time of 150 minutes.  A patella trial was placed and the knee was placed through a range of motion with excellent patellar tracking appreciated. The femoral trial was removed after debridement of posterior osteophytes. The central post-hole for the tibial component was reamed followed by insertion of a keel punch. Tibial trials were then removed.  The right lower extremity was again exsanguinated using an Esmarch and the tourniquet was reinflated to 300 mmHg.  Cut surfaces of bone were irrigated with copious amounts of normal saline using pulsatile lavage and then suctioned dry. Polymethylmethacrylate cement with gentamicin was prepared in the usual fashion  using a vacuum mixer. Cement was applied to the cut surface of the proximal tibia as well as along the undersurface of a size 7 rotating platform tibial component. Tibial component was positioned and impacted into place. Excess cement was removed using Personal assistant. Cement was then applied to the cut surfaces of the femur as well as along the posterior flanges of the size 6 femoral component. The femoral component was positioned and impacted into place. Excess cement was removed using Personal assistant. A 14 mm polyethylene trial was inserted and the knee was brought into full extension with steady axial compression applied. Finally, cement was applied to the backside of a 41 mm medialized dome patella and the patellar component was positioned and patellar clamp applied. Excess cement was removed using Personal assistant. After adequate curing of the cement, the tourniquet was deflated after a second tourniquet time of 18 minutes. Hemostasis was achieved using electrocautery. The knee was irrigated with copious amounts of normal saline using pulsatile lavage followed by 500 ml of Surgiphor and then suctioned dry. 20 mL of 1.3% Exparel and 60 mL of 0.25% Marcaine in 40 mL of normal saline was injected along the posterior capsule, medial and lateral gutters, and along the arthrotomy site. A 14 mm stabilized rotating platform polyethylene insert was inserted and the knee was placed through a range of motion with excellent mediolateral soft tissue balancing appreciated and excellent patellar tracking noted. 2 medium drains were placed in the wound bed and brought out through separate stab incisions. The medial parapatellar portion of the incision was reapproximated using interrupted sutures of #1 Vicryl. Subcutaneous tissue was approximated in layers using first #0 Vicryl followed #2-0  Vicryl. The skin was approximated with skin staples. A sterile dressing was applied.  The patient tolerated the procedure well and was  transported to the recovery room in stable condition.    Tyffani Foglesong P. Holley Bouche., M.D.

## 2020-03-19 NOTE — H&P (Signed)
The patient has been re-examined, and the chart reviewed, and there have been no interval changes to the documented history and physical.    The risks, benefits, and alternatives have been discussed at length. The patient expressed understanding of the risks benefits and agreed with plans for surgical intervention.  Casey Dyer, Jr. M.D.    

## 2020-03-20 LAB — GLUCOSE, CAPILLARY
Glucose-Capillary: 97 mg/dL (ref 70–99)
Glucose-Capillary: 145 mg/dL — ABNORMAL HIGH (ref 70–99)

## 2020-03-20 MED ORDER — ENOXAPARIN SODIUM 40 MG/0.4ML ~~LOC~~ SOLN: 40.0000 mg | SUBCUTANEOUS | 0 refills | Status: AC

## 2020-03-20 MED ORDER — TAMSULOSIN HCL 0.4 MG PO CAPS
0.4000 mg | ORAL_CAPSULE | Freq: Every day | ORAL | Status: DC
  Administered 2020-03-20: 0.4 mg via ORAL
  Filled 2020-03-20 (×2): qty 1

## 2020-03-20 MED ORDER — OXYCODONE HCL 5 MG PO TABS
ORAL_TABLET | ORAL | Status: AC
  Administered 2020-03-20: 10 mg via ORAL
  Filled 2020-03-20: qty 2

## 2020-03-20 MED ORDER — TRAMADOL HCL 50 MG PO TABS: 50.0000 mg | ORAL_TABLET | ORAL | 0 refills | Status: AC | PRN

## 2020-03-20 MED ORDER — OXYCODONE HCL 5 MG PO TABS: 5.0000 mg | ORAL_TABLET | ORAL | 0 refills | Status: AC | PRN

## 2020-03-20 MED ORDER — ACETAMINOPHEN 10 MG/ML IV SOLN
INTRAVENOUS | Status: AC
  Administered 2020-03-20: 1000 mg via INTRAVENOUS
  Filled 2020-03-20: qty 100

## 2020-03-20 MED ORDER — TAMSULOSIN HCL 0.4 MG PO CAPS: 0.4000 mg | ORAL_CAPSULE | Freq: Every day | ORAL | 0 refills | Status: AC

## 2020-03-20 MED ORDER — CELECOXIB 200 MG PO CAPS: 200.0000 mg | ORAL_CAPSULE | Freq: Two times a day (BID) | ORAL | 0 refills | Status: AC

## 2020-03-20 MED ORDER — OXYCODONE HCL 5 MG PO TABS
ORAL_TABLET | ORAL | Status: AC
  Filled 2020-03-20: qty 2

## 2020-03-20 MED ORDER — CELECOXIB 200 MG PO CAPS
ORAL_CAPSULE | ORAL | Status: AC
  Filled 2020-03-20: qty 1

## 2020-03-20 NOTE — Anesthesia Postprocedure Evaluation (Signed)
Anesthesia Post Note  Patient: Casey Dyer  Procedure(s) Performed: RIGHT KNEE REVISION FROM UNI TO A TOTAL (Right Knee)  Patient location during evaluation: Nursing Unit Anesthesia Type: Spinal Level of consciousness: oriented and awake and alert Pain management: pain level controlled Vital Signs Assessment: post-procedure vital signs reviewed and stable Respiratory status: spontaneous breathing, respiratory function stable and patient connected to nasal cannula oxygen Cardiovascular status: blood pressure returned to baseline and stable Postop Assessment: no headache, no backache, no apparent nausea or vomiting, able to ambulate and adequate PO intake Anesthetic complications: no   No complications documented.   Last Vitals:  Vitals:   03/20/20 0922 03/20/20 1304  BP: 118/76 90/63  Pulse: (!) 56 69  Resp:  18  Temp:  36.8 C  SpO2:  100%    Last Pain:  Vitals:   03/20/20 1304  TempSrc: Temporal  PainSc: 0-No pain                 Lenard Simmer

## 2020-03-20 NOTE — Discharge Summary (Signed)
Physician Discharge Summary  Patient ID: TRAVONE GEORG MRN: [id number] DOB/AGE: 08-12-1945 75 y.o.  Admit date: 03/19/2020 Discharge date: 03/20/2020  Admission Diagnoses:  History of revision of total knee arthroplasty [Z96.659]  Surgeries:Procedure(s):  Removal of right medial unicompartmental knee implants Conversion to right total knee arthroplasty using computer-assisted navigation  SURGEON:  Jena Gauss. M.D.  ASSISTANT: Baldwin Jamaica, PA-C (present and scrubbed throughout the case, critical for assistance with exposure, retraction, instrumentation, and closure)  ANESTHESIA: spinal  ESTIMATED BLOOD LOSS: 50 mL  FLUIDS REPLACED: 1500 mL of crystalloid  TOURNIQUET TIME: #1 - 150 minutes         #2 - 18 minutes  DRAINS: 2 medium Hemovac drains  SOFT TISSUE RELEASES: Anterior cruciate ligament, posterior cruciate ligament, deep medial collateral ligament, patellofemoral ligament  IMPLANTS UTILIZED: DePuy Attune size 6 posterior stabilized femoral component (cemented), size 7 rotating platform tibial component (cemented), 41 mm medialized dome patella (cemented), and a 14 mm stabilized rotating platform polyethylene insert.  Discharge Diagnoses: Patient Active Problem List   Diagnosis Date Noted  . CAD (coronary artery disease) 03/19/2020  . Essential hypertension 03/19/2020  . Mixed hyperlipidemia 03/19/2020  . History of revision of total knee arthroplasty 03/19/2020  . Bilateral carotid artery stenosis 02/09/2020  . Atherosclerotic peripheral vascular disease with intermittent claudication (HCC) 01/12/2020  . Loosening of knee joint prosthesis (HCC) 01/11/2020  . Status post right unicompartmental knee replacement 01/11/2020  . Type 2 diabetes mellitus without complication, without long-term current use of insulin (HCC) 02/19/2015  . ED (erectile dysfunction) 08/07/2013  . GERD (gastroesophageal reflux disease) 08/07/2013    Past Medical History:   Diagnosis Date  . Atherosclerotic peripheral vascular disease (HCC)   . Chronic ischemic heart disease   . Coronary artery disease   . Diabetes mellitus, type 2 (HCC)   . ED (erectile dysfunction)   . GERD (gastroesophageal reflux disease)   . Hypertension   . Mixed hyperlipidemia   . Myocardial infarction (HCC)    2007  . Sarcoidosis   . T2DM (type 2 diabetes mellitus) (HCC)      Transfusion:    Consultants (if any):   Discharged Condition: Improved  Hospital Course: CLYDE ZARRELLA is an 75 y.o. male who was admitted 03/19/2020 with a diagnosis of loosening of right unicompartmental knee arthroplasty and went to the operating room on 03/19/2020 and underwent conversion to right total knee arthroplasty. The patient received perioperative antibiotics for prophylaxis (see below). The patient tolerated the procedure well and was transported to PACU in stable condition. Patient was boarded in PACU due to lack of beds in the Orthopedic floor.   The patient received DVT prophylaxis in the form of early mobilization, Lovenox, Foot Pumps and TED hose. A sacral pad had been placed and heels were elevated off of the bed with rolled towels in order to protect skin integrity. Foley catheter was discontinued on postoperative day #0, but was reinserted on POD#1 due to urinary retention. Wound drains were discontinued on postoperative day #1. The surgical incision was healing well without signs of infection.  Overnight and into postop day one patient with urinary retention.  In and out catheter placed x2.  Flomax was started.  Discussed with urology who recommended Foley catheter placement and follow-up in 1 week for catheter removal.  Discussed with patient who will follow-up 1 week, March 26, 2020 with our office.  Foley catheter removal.  Physical therapy was initiated postoperatively for transfers, gait training, and strengthening.  Occupational therapy was initiated for activities of daily living  and evaluation for assisted devices. Rehabilitation goals were reviewed in detail with the patient. The patient made steady progress with physical therapy and physical therapy recommended discharge to Home.     The patient achieved the preliminary goals of this hospitalization and was felt to be medically and orthopaedically appropriate for discharge.  He was given perioperative antibiotics:  Anti-infectives (From admission, onward)   Start     Dose/Rate Route Frequency Ordered Stop   03/19/20 1800  ceFAZolin (ANCEF) IVPB 2g/100 mL premix        2 g 200 mL/hr over 30 Minutes Intravenous Every 6 hours 03/19/20 1703 03/20/20 0030   03/19/20 0625  ceFAZolin (ANCEF) 2-4 GM/100ML-% IVPB       Note to Pharmacy: Mike Craze   : cabinet override      03/19/20 0625 03/19/20 1831   03/19/20 0600  ceFAZolin (ANCEF) IVPB 2g/100 mL premix        2 g 200 mL/hr over 30 Minutes Intravenous On call to O.R. 03/19/20 5732 03/19/20 0810    .  Recent vital signs:  Vitals:   03/20/20 0827 03/20/20 0922  BP: 118/76 118/76  Pulse: (!) 54 (!) 56  Resp: 18   Temp: 97.6 F (36.4 C)   SpO2: 100%     Recent laboratory studies:  No results for input(s): WBC, HGB, HCT, PLT, K, CL, CO2, BUN, CREATININE, GLUCOSE, CALCIUM, LABPT, INR in the last 72 hours.  Diagnostic Studies: DG Knee Right Port  Result Date: 03/19/2020 CLINICAL DATA:  Status post right knee replacement EXAM: PORTABLE RIGHT KNEE - 1-2 VIEW COMPARISON:  None. FINDINGS: Right knee prosthesis is noted in satisfactory position. Surgical drains are noted in place. No acute soft tissue abnormality is seen. IMPRESSION: Status post right knee replacement. Electronically Signed   By: Alcide Clever M.D.   On: 03/19/2020 13:30    Discharge Medications:   Allergies as of 03/20/2020   No Known Allergies     Medication List    STOP taking these medications   aspirin 81 MG chewable tablet     TAKE these medications   celecoxib 200 MG  capsule Commonly known as: CELEBREX Take 1 capsule (200 mg total) by mouth 2 (two) times daily.   enoxaparin 40 MG/0.4ML injection Commonly known as: LOVENOX Inject 0.4 mLs (40 mg total) into the skin daily for 14 days.   famotidine 10 MG tablet Commonly known as: PEPCID Take 10 mg by mouth every evening.   Fish Oil 1000 MG Caps Take 1,000 mg by mouth daily.   glucosamine-chondroitin 500-400 MG tablet Take 1 tablet by mouth in the morning and at bedtime.   lisinopril-hydrochlorothiazide 20-12.5 MG tablet Commonly known as: ZESTORETIC Take 1 tablet by mouth daily.   melatonin 5 MG Tabs Take 5 mg by mouth at bedtime.   metFORMIN 750 MG 24 hr tablet Commonly known as: GLUCOPHAGE-XR Take 750 mg by mouth daily.   metoprolol succinate 25 MG 24 hr tablet Commonly known as: TOPROL-XL Take 25 mg by mouth daily.   oxyCODONE 5 MG immediate release tablet Commonly known as: Oxy IR/ROXICODONE Take 1 tablet (5 mg total) by mouth every 4 (four) hours as needed for severe pain.   simvastatin 40 MG tablet Commonly known as: ZOCOR Take 40 mg by mouth daily.   tadalafil 20 MG tablet Commonly known as: CIALIS Take 20 mg by mouth daily as needed for erectile dysfunction.  tamsulosin 0.4 MG Caps capsule Commonly known as: FLOMAX Take 1 capsule (0.4 mg total) by mouth daily. Start taking on: March 21, 2020   traMADol 50 MG tablet Commonly known as: ULTRAM Take 1 tablet (50 mg total) by mouth every 4 (four) hours as needed for moderate pain.   Vitamin D3 50 MCG (2000 UT) capsule Take 2,000 Units by mouth daily.            Durable Medical Equipment  (From admission, onward)         Start     Ordered   03/19/20 1703  DME Walker rolling  Once       Question:  Patient needs a walker to treat with the following condition  Answer:  Total knee replacement status   03/19/20 1702   03/19/20 1703  DME Bedside commode  Once       Question:  Patient needs a bedside commode to  treat with the following condition  Answer:  Total knee replacement status   03/19/20 1702          Disposition: Home with HH PT      Follow-up Information    Madelyn Flavors, PA-C On 04/02/2020.   Specialty: Orthopedic Surgery Why: at 1:15pm Contact information: [address] [facility] [address] [facility] [place] [address] [phone]        Donato Heinz, MD On 05/04/2020.   Specialty: Orthopedic Surgery Why: at 1:45pm Contact information: [address] [facility] [place] [address] [phone]                Lasandra Beech, PA-C 03/20/2020, 11:33 AM

## 2020-03-20 NOTE — Discharge Instructions (Signed)
Instructions after Total Knee Replacement   James P. Angie Fava., M.D.     Dept. of Orthopaedics & Sports Medicine  [facility]  [address]  [place], [place]  [address]  Phone: [phone]   Fax: [phone]    DIET:  Drink plenty of non-alcoholic fluids.  Resume your normal diet. Include foods high in fiber.  ACTIVITY:   You may use crutches or a walker with weight-bearing as tolerated, unless instructed otherwise.  You may be weaned off of the walker or crutches by your Physical Therapist.   Do NOT place pillows under the knee. Anything placed under the knee could limit your ability to straighten the knee.    Continue doing gentle exercises. Exercising will reduce the pain and swelling, increase motion, and prevent muscle weakness.    Please continue to use the TED compression stockings for 6 weeks. You may remove the stockings at night, but should reapply them in the morning.  Do not drive or operate any equipment until instructed.  WOUND CARE:   Continue to use the PolarCare or ice packs periodically to reduce pain and swelling.  You may bathe or shower after the staples are removed at the first office visit following surgery.  MEDICATIONS:  You may resume your regular medications.  Please take the pain medication as prescribed on the medication.  Do not take pain medication on an empty stomach.  You have been given a prescription for a blood thinner (Lovenox or Coumadin). Please take the medication as instructed. (NOTE: After completing a 2 week course of Lovenox, take one Enteric-coated aspirin once a day. This along with elevation will help reduce the possibility of phlebitis in your operated leg.)  Do not drive or drink alcoholic beverages when taking pain medications.  CALL THE OFFICE FOR:  Temperature above 101 degrees  Excessive bleeding or drainage on the dressing.  Excessive swelling, coldness, or paleness of the toes.  Persistent  nausea and vomiting.  FOLLOW-UP:   You should have an appointment to return to the office in 10-14 days after surgery.  You also have a follow-up appointment Friday, March 26, 2020 with [facility] to remove Foley catheter.  Please call our office Monday, 03/22/2020 to schedule this appointment for March 26, 2020.   Arrangements have been made for continuation of Physical Therapy (either home therapy or outpatient therapy).    [facility] Department Directory         [email or web address]       [email or web address]          Cardiology  Appointments: [place] - [phone]  Endocrinology  Appointments: [place] [phone] [place] - [phone]  Gastroenterology  Appointments: [place] [phone] [place] - [phone]        General Surgery   Appointments: [facility]  Internal Medicine/Family Medicine  Appointments: [facility] [place] - [phone] [place] - [phone]  Metabolic and Weigh Loss Surgery  Appointments: [facility]        Neurology  Appointments: [place] [phone] [place] - [phone]  Neurosurgery  Appointments: [place]  Obstetrics & Gynecology  Appointments: [place] [phone] [place] - [phone]        Pediatrics  Appointments: [place] [phone] [place] - [phone]  Physiatry  Appointments: [place] [phone]  Physical Therapy  Appointments: [facility] [place] - [phone]        Podiatry  Appointments: [place] [phone] [place] - [phone]  Pulmonology  Appointments: [place] [phone]  Rheumatology  Appointments: [facility]        [facility] Location: [facility]  [address] [place], [place]  [address]  [place] Location: [facility] [address] [place], [place]  [address]  [place]  Location: [facility] [address] [place], [place]  [address]

## 2020-03-20 NOTE — Anesthesia Post-op Follow-up Note (Signed)
  Anesthesia Pain Follow-up Note  Patient: Casey Dyer  Day #: 1  Date of Follow-up: 03/20/2020 Time: 3:09 PM  Last Vitals:  Vitals:   03/20/20 0922 03/20/20 1304  BP: 118/76 90/63  Pulse: (!) 56 69  Resp:  18  Temp:  36.8 C  SpO2:  100%    Level of Consciousness: alert  Pain: none   Side Effects:urinary retention; patient had a foley catheter placed that will be removed as an outpatient  Catheter Site Exam:clean, dry  Anti-Coag Meds (From admission, onward)   Start     Dose/Rate Route Frequency Ordered Stop   03/20/20 0800  enoxaparin (LOVENOX) injection 30 mg        30 mg Subcutaneous Every 12 hours 03/19/20 1702     03/20/20 0000  enoxaparin (LOVENOX) 40 MG/0.4ML injection        40 mg Subcutaneous Every 24 hours 03/20/20 1133 04/03/20 2359       Plan: D/C from anesthesia care at surgeon's request  Lenard Simmer

## 2020-03-20 NOTE — Progress Notes (Signed)
Dr. Rosita Kea returned page. Flomax ordered 0.4mg  daily. If pt needs In and out cath placed again then leave foley.

## 2020-03-20 NOTE — TOC Transition Note (Signed)
Transition of Care [facility]) - CM/SW Discharge Note   Patient Details  Name: Casey Dyer MRN: [id number] Date of Birth: May 14, 1945  Transition of Care [facility]) CM/SW Contact:  Larwance Rote, LCSW Phone Number: 03/20/2020, 4:12 PM   Clinical Narrative:    Patient preference for [facility] -- [place] [address]  [place], [place] [address]  [phone]  DME - 5" rolling walker delivered to patient's bedside.  [facility]  Home Health services - [facility] RN and nurse aid.    Final next level of care: OP Rehab Barriers to Discharge: No Barriers Identified   Patient Goals and CMS Choice Patient states their goals for this hospitalization and ongoing recovery are:: Home with self care. Agreed to outpatient PT CMS Medicare.gov Compare Post Acute Care list provided to:: Patient Choice offered to / list presented to : Patient  Discharge Placement                       Discharge Plan and Services In-house Referral: Clinical Social Work   Post Acute Care Choice: NA            DME Agency: [facility] Date DME Agency Contacted: 03/20/20 Time DME Agency Contacted: [phone] Representative spoke with at DME Agency: Nida Boatman HH Arranged: RN,Nurse's Aide [facility] Agency: [facility] Date HH Agency Contacted: 03/20/20 Time HH Agency Contacted: [phone]    Social Determinants of Health (SDOH) Interventions     Readmission Risk Interventions No flowsheet data found.

## 2020-03-20 NOTE — Progress Notes (Signed)
Physical Therapy Treatment Patient Details Name: Casey Dyer MRN: [id number] DOB: [birth year] Today's Date: 03/20/2020    History of Present Illness Pt is s/p R knee revision from uni to a total by Dr. Ernest Pine on 03/20/19. PMH: atherosclerotic PVD, chronic ishemic heart disease, CAD, DM2, GERD, HTN, mixed HLD, MI, sarcoidosis    PT Comments    Provided pt with HEP handout & pt performs remaining 2 exercises with instructional cuing as noted below. Pt voices understanding & comfort with performing all exercises noted in HEP. Pt ambulates room<>stairs with RW & CGA with impaired dorsiflexion & heel strike BLE. Pt negotiates 4 steps with B rails & min assist with cuing for technique. PT educates pt on management of foley catheter during mobility but pt requires ongoing cuing/assist. Pt voices comfort with all education & mobility completed on this date.    Follow Up Recommendations  Outpatient PT     Equipment Recommendations  Rolling walker with 5" wheels;3in1 (PT)    Recommendations for Other Services       Precautions / Restrictions Precautions Precautions: Fall;Knee Restrictions Weight Bearing Restrictions: Yes RLE Weight Bearing: Weight bearing as tolerated    Mobility  Bed Mobility Overal bed mobility: Needs Assistance Bed Mobility: Supine to Sit     Supine to sit: HOB elevated;Supervision Sit to supine: Supervision;HOB elevated      Transfers Overall transfer level: Needs assistance Equipment used: Rolling walker (2 wheeled) Transfers: Sit to/from Stand Sit to Stand: Supervision         General transfer comment: cuing for safe hand placement when using RW  Ambulation/Gait Ambulation/Gait assistance: Min guard Gait Distance (Feet): 150 Feet (+ 150 ft) Assistive device: Rolling walker (2 wheeled) Gait Pattern/deviations: Decreased dorsiflexion - right;Decreased stride length;Decreased dorsiflexion - left;Decreased step length - right;Decreased step length -  left Gait velocity: decreased   General Gait Details: forward trunk lean on RW. Pt appears to have minimal BLE foot dorsiflexion & heel strike.   Stairs Stairs: Yes Stairs assistance: Min assist Stair Management: Two rails Number of Stairs: 4 General stair comments: cuing for compensatory pattern   Wheelchair Mobility    Modified Rankin (Stroke Patients Only)       Balance Overall balance assessment: Needs assistance Sitting-balance support: Feet supported;Bilateral upper extremity supported Sitting balance-Leahy Scale: Good Sitting balance - Comments: Steady static sitting, reaching within BOS.   Standing balance support: No upper extremity supported;During functional activity Standing balance-Leahy Scale: Good Standing balance comment: Steady static standing during functional task w/o UE support.                            Cognition Arousal/Alertness: Awake/alert Behavior During Therapy: WFL for tasks assessed/performed Overall Cognitive Status: Within Functional Limits for tasks assessed                                 General Comments: very pleasant gentleman      Exercises Total Joint Exercises Short Arc Quad: AROM;Right;10 reps;Supine Hip ABduction/ADduction: AROM;Right;Supine;10 reps     General Comments        Pertinent Vitals/Pain Pain Assessment: Faces Pain Score: 2  Faces Pain Scale: Hurts a little bit Pain Location: posterior R knee Pain Descriptors / Indicators: Sore;Aching Pain Intervention(s): Monitored during session;Limited activity within patient's tolerance    Home Living Family/patient expects to be discharged to:: Private residence Living Arrangements: Spouse/significant other  Available Help at Discharge: Family;Available 24 hours/day Type of Home: House Home Access: Level entry   Home Layout: One level Home Equipment: Cane - single point;Crutches      Prior Function Level of Independence: Independent       Comments: driving, active in community, used [facility] PRN 2/2 R knee issues   PT Goals (current goals can now be found in the care plan section) Acute Rehab PT Goals Patient Stated Goal: go home PT Goal Formulation: With patient Time For Goal Achievement: 04/03/20 Potential to Achieve Goals: Good Progress towards PT goals: Progressing toward goals    Frequency    BID      PT Plan Current plan remains appropriate    Co-evaluation              AM-PAC PT "6 Clicks" Mobility   Outcome Measure  Help needed turning from your back to your side while in a flat bed without using bedrails?: None Help needed moving from lying on your back to sitting on the side of a flat bed without using bedrails?: None Help needed moving to and from a bed to a chair (including a wheelchair)?: A Little Help needed standing up from a chair using your arms (e.g., wheelchair or bedside chair)?: A Little Help needed to walk in hospital room?: A Little Help needed climbing 3-5 steps with a railing? : A Little 6 Click Score: 20    End of Session Equipment Utilized During Treatment: Gait belt Activity Tolerance: Patient tolerated treatment well Patient left: in chair;with call bell/phone within reach (BLE foot pumps & polar care donned)   PT Visit Diagnosis: Muscle weakness (generalized) (M62.81);Pain;Difficulty in walking, not elsewhere classified (R26.2) Pain - Right/Left: Right Pain - part of body: Knee     Time: 1511-1530 PT Time Calculation (min) (ACUTE ONLY): 19 min  Charges:  $Therapeutic Activity: 8-22 mins                     Aleda Grana, PT, DPT 03/20/20, 3:45 PM    Sandi Mariscal 03/20/2020, 3:43 PM

## 2020-03-20 NOTE — TOC Initial Note (Signed)
Transition of Care [facility]) - Initial/Assessment Note    Patient Details  Name: Casey Dyer MRN: [id number] Date of Birth: [birth year]  Transition of Care [facility]) CM/SW Contact:    Casey Bouton, LCSW Phone Number: 03/20/2020, 11:50 AM  Clinical Narrative:    CSW met with patient at bedside to discussed discharge plan. Social worker explained to the patient reason for the consult. CSW explain HIPPA.    Physical therapist recommended outpatient PT.   Patient preference for [address] [place], [place] [address]  [phone]  Expected Discharge Plan: OP Rehab Barriers to Discharge: No Barriers Identified   Patient Goals and CMS Choice Patient states their goals for this hospitalization and ongoing recovery are:: Home with self care. Agreed to outpatient PT CMS Medicare.gov Compare Post Acute Care list provided to:: Patient Choice offered to / list presented to : Patient  Expected Discharge Plan and Services Expected Discharge Plan: OP Rehab In-house Referral: Clinical Social Work   Post Acute Care Choice: NA Living arrangements for the past 2 months: Single Family Home                  Prior Living Arrangements/Services Living arrangements for the past 2 months: Single Family Home Lives with:: Spouse Patient language and need for interpreter reviewed:: Yes Do you feel safe going back to the place where you live?: Yes      Need for Family Participation in Patient Care: No (Comment) Care giver support system in place?: Yes (comment)   Criminal Activity/Legal Involvement Pertinent to Current Situation/Hospitalization: No - Comment as needed  Activities of Daily Living Home Assistive Devices/Equipment: Cane (specify quad or straight) ADL Screening (condition at time of admission) Patient's cognitive ability adequate to safely complete daily activities?: Yes Is the patient deaf or have difficulty hearing?: No Does  the patient have difficulty seeing, even when wearing glasses/contacts?: No Does the patient have difficulty concentrating, remembering, or making decisions?: No Patient able to express need for assistance with ADLs?: Yes Does the patient have difficulty dressing or bathing?: No Independently performs ADLs?: Yes (appropriate for developmental age) Does the patient have difficulty walking or climbing stairs?: No Weakness of Legs: None Weakness of Arms/Hands: None  Permission Sought/Granted Permission sought to share information with : Facility Art therapist granted to share information with : Yes, Release of Information Signed  Share Information with NAME: Casey Dyer [phone]           Emotional Assessment Appearance:: Appears stated age Attitude/Demeanor/Rapport: Engaged Affect (typically observed): Calm Orientation: : Oriented to Self,Oriented to Place,Oriented to  Time,Oriented to Situation Alcohol / Substance Use: Not Applicable Psych Involvement: No (comment)  Admission diagnosis:  History of revision of total knee arthroplasty [Z96.659] Patient Active Problem List   Diagnosis Date Noted  . CAD (coronary artery disease) 03/19/2020  . Essential hypertension 03/19/2020  . Mixed hyperlipidemia 03/19/2020  . History of revision of total knee arthroplasty 03/19/2020  . Bilateral carotid artery stenosis 02/09/2020  . Atherosclerotic peripheral vascular disease with intermittent claudication ([place]) 01/12/2020  . Loosening of knee joint prosthesis ([place]) 01/11/2020  . Status post right unicompartmental knee replacement 01/11/2020  . Type 2 diabetes mellitus without complication, without long-term current use of insulin ([place]) 02/19/2015  . ED (erectile dysfunction) 08/07/2013  . GERD (gastroesophageal reflux disease) 08/07/2013   PCP:  Casey Pitch, MD Pharmacy:   [facility] [place]  [place] [address] Phone: [phone] Fax: [phone]   Social Determinants of Health (SDOH) Interventions   Readmission Risk Interventions No flowsheet data found.

## 2020-03-20 NOTE — Progress Notes (Addendum)
   Subjective: 1 Day Post-Op Procedure(s) (LRB): RIGHT KNEE REVISION FROM UNI TO A TOTAL (Right) Patient reports pain as mild.   Patient with urinary retention, in and out cath x 2. Started on flomax. No abd/pelvic pain Denies any CP, SOB, ABD pain. We will start physical therapy today.  Plan is to go Home after hospital stay.  Objective: Vital signs in last 24 hours: Temp:  [97 F (36.1 C)-98.3 F (36.8 C)] 97.1 F (36.2 C) (01/08 0500) Pulse Rate:  [56-107] 107 (01/08 0500) Resp:  [10-22] 16 (01/08 0500) BP: (97-147)/(60-83) 114/60 (01/08 0500) SpO2:  [96 %-100 %] 99 % (01/08 0500)  Intake/Output from previous day: 01/07 0701 - 01/08 0700 In: 3641.6 [P.O.:600; I.V.:2531.6; IV Piggyback:510] Out: 1825 [Urine:1700; Drains:75; Blood:50] Intake/Output this shift: No intake/output data recorded.  No results for input(s): HGB in the last 72 hours. No results for input(s): WBC, RBC, HCT, PLT in the last 72 hours. No results for input(s): NA, K, CL, CO2, BUN, CREATININE, GLUCOSE, CALCIUM in the last 72 hours. No results for input(s): LABPT, INR in the last 72 hours.  EXAM General - Patient is Alert, Appropriate and Oriented  Abdomen - soft non-tender, non distended Extremity - Neurovascular intact Sensation intact distally Intact pulses distally Dorsiflexion/Plantar flexion intact No cellulitis present Compartment soft Dressing - dressing C/D/I and no drainage, hemovac intact Motor Function - intact, moving foot and toes well on exam.   Past Medical History:  Diagnosis Date  . Atherosclerotic peripheral vascular disease (HCC)   . Chronic ischemic heart disease   . Coronary artery disease   . Diabetes mellitus, type 2 (HCC)   . ED (erectile dysfunction)   . GERD (gastroesophageal reflux disease)   . Hypertension   . Mixed hyperlipidemia   . Myocardial infarction (HCC)    2007  . Sarcoidosis   . T2DM (type 2 diabetes mellitus) (HCC)     Assessment/Plan:   1 Day  Post-Op Procedure(s) (LRB): RIGHT KNEE REVISION FROM UNI TO A TOTAL (Right) Active Problems:   History of revision of total knee arthroplasty  Estimated body mass index is 28.29 kg/m as calculated from the following:   Height as of this encounter: 5\' 11"  (1.803 m).   Weight as of this encounter: 92 kg. Advance diet Up with therapy  Work on BM Pain controlled Urinary retention - flomax ordered. If continued retention, insert foley  VSS CM to assist with discharge to home with HHPT   DVT Prophylaxis - Lovenox, Foot Pumps and TED hose Weight-Bearing as tolerated to right leg   T. , PA-C [facility] 03/20/2020, 7:59 AM  Discussed with urology, will send home with catheter for one week and have home health remove next Friday.

## 2020-03-20 NOTE — Evaluation (Signed)
Occupational Therapy Evaluation Patient Details Name: Casey Dyer MRN: [id number] DOB: January 03, 1946 Today's Date: 03/20/2020    History of Present Illness Pt is s/p R knee revision from uni to a total by Dr. Ernest Pine on 03/20/19. PMH: atherosclerotic PVD, chronic ishemic heart disease, CAD, DM2, GERD, HTN, mixed HLD, MI, sarcoidosis   Clinical Impression   Mr. Caster was seen for OT evaluation this date, POD#1 from above surgery. Pt was independent in all ADLs prior to surgery. He endorses living at home with his spouse in a 1 level house with a level entry. Pt is eager to return to PLOF with less pain and improved safety and independence. Pt currently requires minimal assist for LB dressing while from sit to stand due to pain and limited AROM of R knee. Pt/caregiver (spouse at bedside) instructed in polar care mgt, falls prevention strategies, home/routines modifications, DME/AE for LB bathing and dressing tasks, and compression stocking mgt. Pt would benefit from skilled OT services including additional instruction in dressing techniques with or without assistive devices for dressing and bathing skills to support recall and carryover prior to discharge and ultimately to maximize safety, independence, and minimize falls risk and caregiver burden. Do not currently anticipate any OT needs following this hospitalization.       Follow Up Recommendations  No OT follow up    Equipment Recommendations  None recommended by OT    Recommendations for Other Services       Precautions / Restrictions Precautions Precautions: Fall;Knee Restrictions Weight Bearing Restrictions: Yes RLE Weight Bearing: Weight bearing as tolerated      Mobility Bed Mobility Overal bed mobility: Needs Assistance Bed Mobility: Supine to Sit;Sit to Supine     Supine to sit: HOB elevated;Supervision Sit to supine: Supervision;HOB elevated        Transfers Overall transfer level: Needs assistance Equipment  used: Rolling walker (2 wheeled) Transfers: Sit to/from Stand Sit to Stand: Min guard;Supervision         General transfer comment: cuing for safe hand placement when using RW    Balance Overall balance assessment: Needs assistance Sitting-balance support: Feet supported;Bilateral upper extremity supported Sitting balance-Leahy Scale: Good Sitting balance - Comments: Steady static sitting, reaching within BOS.   Standing balance support: No upper extremity supported;During functional activity Standing balance-Leahy Scale: Good Standing balance comment: Steady static standing during functional task w/o UE support.                           ADL either performed or assessed with clinical judgement   ADL Overall ADL's : Needs assistance/impaired                                       General ADL Comments: Pt functionally limited by increased pain and decreased AROM of his RLE. He performs LB dressing dressing with MIN A to assist with threading catheter and BLE through pants this date. He requires MOD/MAX A for compression stocking management with wife able to assist.     Vision Patient Visual Report: No change from baseline       Perception     Praxis      Pertinent Vitals/Pain Pain Assessment: 0-10 Pain Score: 2  Faces Pain Scale: Hurts a little bit Pain Location: posterior R knee Pain Descriptors / Indicators: Sore;Aching Pain Intervention(s): Limited activity within patient's tolerance;Monitored during session;Premedicated  before session;Ice applied     Hand Dominance     Extremity/Trunk Assessment Upper Extremity Assessment Upper Extremity Assessment: Overall WFL for tasks assessed   Lower Extremity Assessment Lower Extremity Assessment: RLE deficits/detail;Defer to PT evaluation RLE Deficits / Details: s/p R TKR RLE Coordination: decreased fine motor;decreased gross motor       Communication Communication Communication: HOH    Cognition Arousal/Alertness: Awake/alert Behavior During Therapy: WFL for tasks assessed/performed Overall Cognitive Status: Within Functional Limits for tasks assessed                                 General Comments: very pleasant gentleman   General Comments  Pt pulse ox noting SpO2 in 70s but pt not in distress & nurse aware noting pulse ox is not getting accurate reading.    Exercises Total Joint Exercises Ankle Circles/Pumps: AROM;10 reps;Right;Supine Quad Sets: AROM;Right;10 reps;Supine Heel Slides: AROM;10 reps;Right;Supine Straight Leg Raises: AROM;10 reps;Right;Supine Long Arc Quad: AROM;10 reps;Right;Seated Knee Flexion: AROM;Right;10 reps;Seated Goniometric ROM: 2 degrees from full extension (supine, performing quad set), 92 degrees flexion (seated EOB) Other Exercises Other Exercises: Pt/caregiver (Spouse at bedside) educated on role of OT in acute setting, safe use of AE/DME for ADL management, polar care management strategies, falls prevention strategies, and routines modifications to support safety and functional independence upon hospital DC. Handout provided to support recall and carryover of education.   Shoulder Instructions      Home Living Family/patient expects to be discharged to:: Private residence Living Arrangements: Spouse/significant other Available Help at Discharge: Family;Available 24 hours/day Type of Home: House Home Access: Level entry     Home Layout: One level               Home Equipment: Cane - single point;Crutches          Prior Functioning/Environment Level of Independence: Independent        Comments: driving, active in community, used [facility] PRN 2/2 R knee issues        OT Problem List: Decreased strength;Decreased coordination;Pain;Decreased activity tolerance;Decreased safety awareness;Impaired balance (sitting and/or standing);Decreased knowledge of use of DME or AE      OT Treatment/Interventions:  Self-care/ADL training;Therapeutic exercise;Therapeutic activities;DME and/or AE instruction;Patient/family education;Balance training    OT Goals(Current goals can be found in the care plan section) Acute Rehab OT Goals Patient Stated Goal: go home OT Goal Formulation: With patient Time For Goal Achievement: 04/03/20 Potential to Achieve Goals: Good ADL Goals Pt Will Perform Lower Body Dressing: sit to/from stand;with modified independence;with adaptive equipment (C LRAD PRN for improved safety and functional indep.) Pt Will Transfer to Toilet: regular height toilet;bedside commode;with modified independence (C LRAD PRN for improved safety and functional indep.) Pt Will Perform Toileting - Clothing Manipulation and hygiene: with modified independence;with adaptive equipment;sit to/from stand (C LRAD PRN for improved safety and functional indep.)  OT Frequency: Min 1X/week   Barriers to D/C:            Co-evaluation              AM-PAC OT "6 Clicks" Daily Activity     Outcome Measure Help from another person eating meals?: None Help from another person taking care of personal grooming?: None Help from another person toileting, which includes using toliet, bedpan, or urinal?: A Little Help from another person bathing (including washing, rinsing, drying)?: A Little Help from another person to put on and taking  off regular upper body clothing?: A Little Help from another person to put on and taking off regular lower body clothing?: A Little 6 Click Score: 20   End of Session Equipment Utilized During Treatment: Rolling walker Nurse Communication: Mobility status  Activity Tolerance: Patient tolerated treatment well Patient left: in bed;with call bell/phone within reach;with bed alarm set;with family/visitor present  OT Visit Diagnosis: Other abnormalities of gait and mobility (R26.89);Pain Pain - Right/Left: Right Pain - part of body: Knee                Time: 1120-1156 OT  Time Calculation (min): 36 min Charges:  OT General Charges $OT Visit: 1 Visit OT Evaluation $OT Eval Low Complexity: 1 Low OT Treatments $Self Care/Home Management : 23-37 mins  Rockney Ghee, M.S., OTR/L Ascom: [phone] 03/20/20, 1:43 PM

## 2020-03-20 NOTE — Progress Notes (Addendum)
Pt c/o needing to void but unable to. Set on side of the bed, also stood up at bedside with walker and Nurse assisting and still unable to void. Paged Dr. Rosita Kea, pt c/o a lot of pain with previous in and out cath. bladder scan.   No response from MD, Secured chat performed and also paged again at 323am.  No return call, paged Dr.Hooten.   Premedicated for foley in and out placement at pt request. Pt tolerated with some noted discomfort. No return calls from MD, will pass along at shift change as well.

## 2020-03-20 NOTE — Evaluation (Signed)
Physical Therapy Evaluation Patient Details Name: Casey Dyer MRN: [id number] DOB: 06-21-1945 Today's Date: 03/20/2020   History of Present Illness  Pt is s/p R knee revision from uni to a total by Dr. Ernest Pine on 03/20/19. PMH: atherosclerotic PVD, chronic ishemic heart disease, CAD, DM2, GERD, HTN, mixed HLD, MI, sarcoidosis  Clinical Impression  Pt seen for PT evaluation following R knee revision. Pt is currently moving well with little assist with functional mobility (supervision bed mobility, min guard gait with RW). Pt does require cuing for safe use of RW for increased distances & does experience 1 episode of R knee buckling in standing but is able to self correct with only min assist. Pt attempts to void standing in bathroom during session but unable. Pt performs RLE strengthening exercises as instructed by PT with pt demonstrating good strength in RLE. Educated pt on benefits of OPPT vs HPPT with recommendation of OPPT f/u upon d/c from acute setting, as well as need to use RW (vs crutches or cane) at this time & pt voiced understanding. Will continue to follow pt acutely to progress gait with LRAD, standing balance, and RLE strengthening/ROM exercises.    Follow Up Recommendations Outpatient PT    Equipment Recommendations  Rolling walker with 5" wheels;3in1 (PT)    Recommendations for Other Services       Precautions / Restrictions Precautions Precautions: Fall;Knee Restrictions Weight Bearing Restrictions: Yes RLE Weight Bearing: Weight bearing as tolerated      Mobility  Bed Mobility Overal bed mobility: Needs Assistance Bed Mobility: Supine to Sit;Sit to Supine     Supine to sit: HOB elevated;Supervision Sit to supine: Supervision;HOB elevated        Transfers Overall transfer level: Needs assistance Equipment used: Rolling walker (2 wheeled) Transfers: Sit to/from Stand Sit to Stand: Min guard         General transfer comment: cuing for safe hand  placement when using RW  Ambulation/Gait Ambulation/Gait assistance: Min guard Gait Distance (Feet): 175 Feet Assistive device: Rolling walker (2 wheeled) Gait Pattern/deviations: Decreased dorsiflexion - right;Decreased stride length     General Gait Details: cuing to ambulate within base of AD  Stairs            Wheelchair Mobility    Modified Rankin (Stroke Patients Only)       Balance Overall balance assessment: Needs assistance Sitting-balance support: Feet supported;Bilateral upper extremity supported Sitting balance-Leahy Scale: Good     Standing balance support: No upper extremity supported;During functional activity Standing balance-Leahy Scale: Fair Standing balance comment: pt able to stand to attempt to void without UE support & min assist from PT but no LOB noted                             Pertinent Vitals/Pain Pain Assessment: Faces Faces Pain Scale: Hurts a little bit Pain Location: posterior R knee Pain Descriptors / Indicators: Sore;Aching Pain Intervention(s): Monitored during session;Patient requesting pain meds-RN notified;RN gave pain meds during session;Ice applied (polar care at end of session)    Home Living Family/patient expects to be discharged to:: Private residence Living Arrangements: Spouse/significant other Available Help at Discharge: Family;Available 24 hours/day Type of Home: House Home Access: Level entry     Home Layout: One level Home Equipment: Cane - single point;Crutches      Prior Function Level of Independence: Independent         Comments: driving, active in community, used  SPC PRN 2/2 R knee issues     Hand Dominance        Extremity/Trunk Assessment   Upper Extremity Assessment Upper Extremity Assessment: Overall WFL for tasks assessed    Lower Extremity Assessment Lower Extremity Assessment:  (RLE strength not formally tested 2/2 recent TKA, pt able to perform R knee LAQ through  almost full ROM)       Communication   Communication: HOH (pt has hearing aides but they're at home)  Cognition Arousal/Alertness: Awake/alert Behavior During Therapy: WFL for tasks assessed/performed Overall Cognitive Status: Within Functional Limits for tasks assessed                                 General Comments: very pleasant gentleman      General Comments General comments (skin integrity, edema, etc.): Pt pulse ox noting SpO2 in 70s but pt not in distress & nurse aware noting pulse ox is not getting accurate reading.    Exercises Total Joint Exercises Ankle Circles/Pumps: AROM;10 reps;Right;Supine Quad Sets: AROM;Right;10 reps;Supine Heel Slides: AROM;10 reps;Right;Supine Straight Leg Raises: AROM;10 reps;Right;Supine Long Arc Quad: AROM;10 reps;Right;Seated Knee Flexion: AROM;Right;10 reps;Seated Goniometric ROM: 2 degrees from full extension (supine, performing quad set), 92 degrees flexion (seated EOB)   Assessment/Plan    PT Assessment Patient needs continued PT services  PT Problem List Decreased strength;Decreased balance;Pain;Decreased range of motion;Decreased knowledge of use of DME;Decreased mobility;Decreased activity tolerance       PT Treatment Interventions Functional mobility training;DME instruction;Balance training;Patient/family education;Modalities;Gait training;Therapeutic activities;Neuromuscular re-education;Stair training;Therapeutic exercise;Manual techniques    PT Goals (Current goals can be found in the Care Plan section)  Acute Rehab PT Goals Patient Stated Goal: go home PT Goal Formulation: With patient Time For Goal Achievement: 04/03/20 Potential to Achieve Goals: Good    Frequency BID   Barriers to discharge        Co-evaluation               AM-PAC PT "6 Clicks" Mobility  Outcome Measure Help needed turning from your back to your side while in a flat bed without using bedrails?: None Help needed moving  from lying on your back to sitting on the side of a flat bed without using bedrails?: None Help needed moving to and from a bed to a chair (including a wheelchair)?: A Little Help needed standing up from a chair using your arms (e.g., wheelchair or bedside chair)?: A Little Help needed to walk in hospital room?: A Little Help needed climbing 3-5 steps with a railing? : A Lot 6 Click Score: 19    End of Session Equipment Utilized During Treatment: Gait belt Activity Tolerance: Patient tolerated treatment well Patient left: in bed;with call bell/phone within reach;with bed alarm set (BLE foot pumps & polar care donned) Nurse Communication: Mobility status (inability to void) PT Visit Diagnosis: Muscle weakness (generalized) (M62.81);Pain;Difficulty in walking, not elsewhere classified (R26.2) Pain - Right/Left: Right Pain - part of body: Knee    Time: 1638-4536 PT Time Calculation (min) (ACUTE ONLY): 34 min   Charges:   PT Evaluation $PT Eval Low Complexity: 1 Low PT Treatments $Therapeutic Exercise: 8-22 mins $Therapeutic Activity: 8-22 mins        Aleda Grana, PT, DPT 03/20/20, 10:31 AM   Sandi Mariscal 03/20/2020, 10:29 AM

## 2020-03-22 ENCOUNTER — Encounter: Payer: Self-pay | Admitting: Orthopedic Surgery

## 2020-03-26 ENCOUNTER — Ambulatory Visit (INDEPENDENT_AMBULATORY_CARE_PROVIDER_SITE_OTHER): Payer: Medicare Other | Admitting: Urology

## 2020-03-26 ENCOUNTER — Encounter: Payer: Self-pay | Admitting: Urology

## 2020-03-26 ENCOUNTER — Other Ambulatory Visit: Payer: Self-pay

## 2020-03-26 VITALS — BP 131/81 | HR 94 | Ht 71.0 in | Wt 196.0 lb

## 2020-03-26 DIAGNOSIS — R338 Other retention of urine: Secondary | ICD-10-CM

## 2020-03-26 DIAGNOSIS — N9989 Other postprocedural complications and disorders of genitourinary system: Secondary | ICD-10-CM | POA: Diagnosis not present

## 2020-03-26 NOTE — Progress Notes (Signed)
03/26/2020 9:21 AM   Casey Dyer 05-25-1945 [id number]  Referring provider: Dorothey Baseman, MD [phone] [place],  [place] [address]  Chief Complaint  Patient presents with  . Urinary Retention    HPI: Casey Dyer is a 75 y.o. male who presents today for evaluation of postoperative urinary retention.   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 Atherosclerotic peripheral vascular disease (HCC)   . Chronic ischemic heart disease   . Coronary artery disease   . Diabetes mellitus, type 2 (HCC)   . ED (erectile dysfunction)   . GERD (gastroesophageal reflux disease)   . Hypertension   . Mixed hyperlipidemia   . Myocardial infarction (HCC)    2007  . Sarcoidosis   . T2DM (type 2 diabetes mellitus) (HCC)     Surgical History: Past Surgical History:  Procedure Laterality Date  . BONE EXCISION Right 05/29/2019   Procedure: PARTIAL EXCISION BONE PHALANX 2ND RIGHT;  Surgeon: Recardo Evangelist, DPM;  Location: [facility];  Service: Podiatry;  Laterality: Right;  LMA LOCAL Diabetic - oral meds  . COLONOSCOPY    . COLONOSCOPY WITH PROPOFOL N/A 09/18/2016   Procedure: COLONOSCOPY WITH PROPOFOL;  Surgeon: Christena Deem, MD;  Location: [facility] ENDOSCOPY;  Service: Endoscopy;  Laterality: N/A;  . heart stents  2007   4 stents.  [facility]  . HERNIA REPAIR     UMBILICUS  . partial right knee replacement Right   . polpectomy    . REPLACEMENT TOTAL KNEE BILATERAL Left   . rtk Right   . TOTAL KNEE REVISION Right 03/19/2020   Procedure: RIGHT KNEE REVISION FROM UNI TO A TOTAL;  Surgeon: Donato Heinz, MD;  Location: [facility] ORS;  Service: Orthopedics;  Laterality:  Right;    Home Medications:  Allergies as of 03/26/2020   No Known Allergies     Medication List       Accurate as of March 26, 2020  9:21 AM. If you have any questions, ask your nurse or doctor.        celecoxib 200 MG capsule Commonly known as: CELEBREX Take 1 capsule (200 mg total) by mouth 2 (two) times daily.   enoxaparin 40 MG/0.4ML injection Commonly known as: LOVENOX Inject 0.4 mLs (40 mg total) into the skin daily for 14 days.   famotidine 10 MG tablet Commonly known as: PEPCID Take 10 mg by mouth every evening.   Fish Oil 1000 MG Caps Take 1,000 mg by mouth daily.   glucosamine-chondroitin 500-400 MG tablet Take 1 tablet by mouth in the morning and at bedtime.   lisinopril-hydrochlorothiazide 20-12.5 MG tablet Commonly known as: ZESTORETIC Take 2 tablets by mouth daily.   melatonin 5 MG Tabs Take 5 mg by mouth at bedtime.   metFORMIN 750 MG 24 hr tablet Commonly known as: GLUCOPHAGE-XR Take 750 mg by mouth daily.   metoprolol succinate 25 MG 24 hr tablet Commonly known as: TOPROL-XL Take 25 mg by mouth daily.   oxyCODONE 5 MG immediate release tablet Commonly known as: Oxy IR/ROXICODONE Take 1 tablet (5 mg total) by mouth every 4 (four) hours as needed for severe pain.   simvastatin 40 MG tablet  Commonly known as: ZOCOR Take 40 mg by mouth daily.   tadalafil 20 MG tablet Commonly known as: CIALIS Take 20 mg by mouth daily as needed for erectile dysfunction.   tamsulosin 0.4 MG Caps capsule Commonly known as: FLOMAX Take 1 capsule (0.4 mg total) by mouth daily.   traMADol 50 MG tablet Commonly known as: ULTRAM Take 1 tablet (50 mg total) by mouth every 4 (four) hours as needed for moderate pain.   Vitamin D3 50 MCG (2000 UT) capsule Take 2,000 Units by mouth daily.       Allergies: No Known Allergies  Family History: No family history on file.  Social History:  reports that he quit smoking about 47 years ago. His smoking use  included cigarettes. He has never used smokeless tobacco. He reports current alcohol use of about 14.0 standard drinks of alcohol per week. He reports that he does not use drugs.   Physical Exam: BP 131/81 (BP Location: Left Arm, Patient Position: Sitting, Cuff Size: Large)   Pulse 94   Ht 5\' 11"  (1.803 m)   Wt 196 lb (88.9 kg)   BMI 27.34 kg/m   Constitutional:  Alert and oriented, No acute distress. HEENT: [place] AT, moist mucus membranes.  Trachea midline, no masses. Cardiovascular: No clubbing, cyanosis, or edema. Respiratory: Normal respiratory effort, no increased work of breathing. GU: Foley catheter draining clear urine Skin: No rashes, bruises or suspicious lesions. Psychiatric: Normal mood and affect.   Assessment & Plan:    1.  Postoperative urinary retention  We discussed the most common cause of BPH  Catheter removed this morning for a voiding trial  Instructed to call today if unable to void or voiding difficulty  Follow-up 1 month for bladder scan for PVR and DRE  Will decide at that time if he needs to continue tamsulosin   , MD  [facility] [address] [place], [place] [phone]

## 2020-04-28 ENCOUNTER — Encounter: Payer: Self-pay | Admitting: Urology

## 2020-04-28 ENCOUNTER — Other Ambulatory Visit: Payer: Self-pay

## 2020-04-28 ENCOUNTER — Ambulatory Visit (INDEPENDENT_AMBULATORY_CARE_PROVIDER_SITE_OTHER): Payer: Medicare Other | Admitting: Urology

## 2020-04-28 VITALS — BP 143/77 | HR 68 | Ht 71.0 in | Wt 203.0 lb

## 2020-04-28 DIAGNOSIS — N401 Enlarged prostate with lower urinary tract symptoms: Secondary | ICD-10-CM | POA: Diagnosis not present

## 2020-04-28 DIAGNOSIS — N9989 Other postprocedural complications and disorders of genitourinary system: Secondary | ICD-10-CM | POA: Diagnosis not present

## 2020-04-28 DIAGNOSIS — R338 Other retention of urine: Secondary | ICD-10-CM

## 2020-04-28 LAB — BLADDER SCAN AMB NON-IMAGING: Scan Result: 34

## 2020-04-28 NOTE — Progress Notes (Signed)
04/28/2020 10:36 AM   Margit Hanks 13-Sep-1945 [id number]  Referring provider: Dorothey Baseman, MD [phone] [place],  [place] [address]  Chief Complaint  Patient presents with  . Other    HPI: 75 y.o. male presents for follow-up.   Initially seen 03/26/2020 for urinary retention occurring after right total knee replacement 03/19/2020  Voiding trial successful  Ran out of tamsulosin 10 days ago and has not seen any worsening of his voiding pattern.  Feels he is voiding at baseline   PMH: Past Medical History:  Diagnosis Date  . Atherosclerotic peripheral vascular disease (HCC)   . Chronic ischemic heart disease   . Coronary artery disease   . Diabetes mellitus, type 2 (HCC)   . ED (erectile dysfunction)   . GERD (gastroesophageal reflux disease)   . Hypertension   . Mixed hyperlipidemia   . Myocardial infarction (HCC)    2007  . Sarcoidosis   . T2DM (type 2 diabetes mellitus) (HCC)     Surgical History: Past Surgical History:  Procedure Laterality Date  . BONE EXCISION Right 05/29/2019   Procedure: PARTIAL EXCISION BONE PHALANX 2ND RIGHT;  Surgeon: Recardo Evangelist, DPM;  Location: [facility];  Service: Podiatry;  Laterality: Right;  LMA LOCAL Diabetic - oral meds  . COLONOSCOPY    . COLONOSCOPY WITH PROPOFOL N/A 09/18/2016   Procedure: COLONOSCOPY WITH PROPOFOL;  Surgeon: Christena Deem, MD;  Location: [facility] END[facility]COPY;  Service: Endoscopy;  Laterality: N/A;  . heart stents  2007   4 stents.  [facility]  . HERNIA REPAIR     UMBILICUS  . partial right knee replacement Right   . polpectomy    . REPLACEMENT TOTAL KNEE BILATERAL Left   . rtk Right   . TOTAL KNEE REVISION Right 03/19/2020   Procedure: RIGHT KNEE REVISION FROM UNI TO A TOTAL;  Surgeon: Donato Heinz, MD;  Location: [facility] ORS;  Service: Orthopedics;  Laterality: Right;    Home Medications:  Allergies as of 04/28/2020   No Known Allergies     Medication List       Accurate as of  April 28, 2020 10:36 AM. If you have any questions, ask your nurse or doctor.        celecoxib 200 MG capsule Commonly known as: CELEBREX Take 1 capsule (200 mg total) by mouth 2 (two) times daily.   enoxaparin 40 MG/0.4ML injection Commonly known as: LOVENOX Inject 0.4 mLs (40 mg total) into the skin daily for 14 days.   famotidine 10 MG tablet Commonly known as: PEPCID Take 10 mg by mouth every evening.   Fish Oil 1000 MG Caps Take 1,000 mg by mouth daily.   glucosamine-chondroitin 500-400 MG tablet Take 1 tablet by mouth in the morning and at bedtime.   lisinopril-hydrochlorothiazide 20-12.5 MG tablet Commonly known as: ZESTORETIC Take 2 tablets by mouth daily.   melatonin 5 MG Tabs Take 5 mg by mouth at bedtime.   metFORMIN 750 MG 24 hr tablet Commonly known as: GLUCOPHAGE-XR Take 750 mg by mouth daily.   metoprolol succinate 25 MG 24 hr tablet Commonly known as: TOPROL-XL Take 25 mg by mouth daily.   oxyCODONE 5 MG immediate release tablet Commonly known as: Oxy IR/ROXICODONE Take 1 tablet (5 mg total) by mouth every 4 (four) hours as needed for severe pain.   simvastatin 40 MG tablet Commonly known as: ZOCOR Take 40 mg by mouth daily.   tadalafil 20 MG tablet Commonly known as:  CIALIS Take 20 mg by mouth daily as needed for erectile dysfunction.   tamsulosin 0.4 MG Caps capsule Commonly known as: FLOMAX Take 1 capsule (0.4 mg total) by mouth daily.   traMADol 50 MG tablet Commonly known as: ULTRAM Take 1 tablet (50 mg total) by mouth every 4 (four) hours as needed for moderate pain.   Vitamin D3 50 MCG (2000 UT) capsule Take 2,000 Units by mouth daily.       Allergies: No Known Allergies  Family History: No family history on file.  Social History:  reports that he quit smoking about 47 years ago. His smoking use included cigarettes. He has never used smokeless tobacco. He reports current alcohol use of about 14.0 standard drinks of alcohol  per week. He reports that he does not use drugs.   Physical Exam: BP (!) 143/77   Pulse 68   Ht 5\' 11"  (1.803 m)   Wt 203 lb (92.1 kg)   BMI 28.31 kg/m   Constitutional:  Alert and oriented, No acute distress. HEENT: [place] AT, moist mucus membranes.  Trachea midline, no masses. Cardiovascular: No clubbing, cyanosis, or edema. Respiratory: Normal respiratory effort, no increased work of breathing GU: Prostate 35 g, smooth without nodules Skin: No rashes, bruises or suspicious lesions. Neurologic: Grossly intact, no focal deficits, moving all 4 extremities. Psychiatric: Normal mood and affect.   Assessment & Plan:    1. Postoperative urinary retention  Resolved   2.  BPH with LUTS  Mild voiding symptoms which are not bothersome  Off tamsulosin x10 days and bladder scan PVR was 34 mL  Discussed he is at risk for postoperative urinary retention in the future and would consider starting tamsulosin 2 weeks prior to any planned procedures  He would like to follow-up annually and instructed call earlier for any change in his voiding pattern.   , MD  [facility] [address] [place], [place] [phone]

## 2021-03-10 ENCOUNTER — Encounter: Payer: Self-pay | Admitting: Family Medicine

## 2021-04-28 ENCOUNTER — Ambulatory Visit: Payer: Self-pay | Admitting: Urology

## 2021-04-29 ENCOUNTER — Ambulatory Visit: Payer: Medicare Other | Admitting: Urology

## 2022-01-09 ENCOUNTER — Ambulatory Visit: Payer: Medicare Other | Admitting: Anesthesiology

## 2022-01-09 ENCOUNTER — Ambulatory Visit
Admission: RE | Admit: 2022-01-09 | Discharge: 2022-01-09 | Disposition: A | Discharge status: Home / Self Care | Payer: Medicare Other | Source: Ambulatory Visit | Attending: Gastroenterology | Admitting: Gastroenterology

## 2022-01-09 ENCOUNTER — Encounter
Admission: RE | Disposition: A | Discharge status: Home / Self Care | Payer: Self-pay | Source: Ambulatory Visit | Attending: Gastroenterology

## 2022-01-09 ENCOUNTER — Encounter: Payer: Self-pay | Admitting: *Deleted

## 2022-01-09 ENCOUNTER — Encounter (INDEPENDENT_AMBULATORY_CARE_PROVIDER_SITE_OTHER): Payer: Self-pay

## 2022-01-09 DIAGNOSIS — Z8719 Personal history of other diseases of the digestive system: Secondary | ICD-10-CM | POA: Diagnosis not present

## 2022-01-09 DIAGNOSIS — Z1211 Encounter for screening for malignant neoplasm of colon: Secondary | ICD-10-CM | POA: Diagnosis not present

## 2022-01-09 DIAGNOSIS — Z79899 Other long term (current) drug therapy: Secondary | ICD-10-CM | POA: Diagnosis not present

## 2022-01-09 DIAGNOSIS — E1151 Type 2 diabetes mellitus with diabetic peripheral angiopathy without gangrene: Secondary | ICD-10-CM | POA: Diagnosis not present

## 2022-01-09 DIAGNOSIS — I10 Essential (primary) hypertension: Secondary | ICD-10-CM | POA: Diagnosis not present

## 2022-01-09 DIAGNOSIS — Z8249 Family history of ischemic heart disease and other diseases of the circulatory system: Secondary | ICD-10-CM | POA: Diagnosis not present

## 2022-01-09 DIAGNOSIS — K219 Gastro-esophageal reflux disease without esophagitis: Secondary | ICD-10-CM | POA: Insufficient documentation

## 2022-01-09 DIAGNOSIS — Z955 Presence of coronary angioplasty implant and graft: Secondary | ICD-10-CM | POA: Insufficient documentation

## 2022-01-09 DIAGNOSIS — I252 Old myocardial infarction: Secondary | ICD-10-CM | POA: Insufficient documentation

## 2022-01-09 DIAGNOSIS — Z800 Family history of malignant neoplasm of digestive organs: Secondary | ICD-10-CM | POA: Insufficient documentation

## 2022-01-09 DIAGNOSIS — K5730 Diverticulosis of large intestine without perforation or abscess without bleeding: Secondary | ICD-10-CM | POA: Diagnosis not present

## 2022-01-09 DIAGNOSIS — I2510 Atherosclerotic heart disease of native coronary artery without angina pectoris: Secondary | ICD-10-CM | POA: Diagnosis not present

## 2022-01-09 DIAGNOSIS — K640 First degree hemorrhoids: Secondary | ICD-10-CM | POA: Insufficient documentation

## 2022-01-09 DIAGNOSIS — Z7984 Long term (current) use of oral hypoglycemic drugs: Secondary | ICD-10-CM | POA: Insufficient documentation

## 2022-01-09 HISTORY — PX: COLONOSCOPY WITH PROPOFOL: SHX5780

## 2022-01-09 LAB — GLUCOSE, CAPILLARY: Glucose-Capillary: 102 mg/dL — ABNORMAL HIGH (ref 70–99)

## 2022-01-09 SURGERY — COLONOSCOPY WITH PROPOFOL
Anesthesia: General

## 2022-01-09 MED ORDER — SODIUM CHLORIDE 0.9 % IV SOLN: INTRAVENOUS | Status: DC

## 2022-01-09 MED ORDER — PROPOFOL 500 MG/50ML IV EMUL
INTRAVENOUS | Status: DC | PRN
  Administered 2022-01-09: 150 ug/kg/min via INTRAVENOUS

## 2022-01-09 MED ORDER — STERILE WATER FOR IRRIGATION IR SOLN
Status: DC | PRN
  Administered 2022-01-09: 50 mL

## 2022-01-09 MED ORDER — LIDOCAINE HCL (CARDIAC) PF 100 MG/5ML IV SOSY
PREFILLED_SYRINGE | INTRAVENOUS | Status: DC | PRN
  Administered 2022-01-09: 50 mg via INTRAVENOUS

## 2022-01-09 MED ORDER — PROPOFOL 10 MG/ML IV BOLUS
INTRAVENOUS | Status: DC | PRN
  Administered 2022-01-09: 80 mg via INTRAVENOUS

## 2022-01-09 NOTE — Interval H&P Note (Signed)
History and Physical Interval Note:  01/09/2022 9:52 AM  Casey Dyer  has presented today for surgery, with the diagnosis of FH Colon Cancer.  The various methods of treatment have been discussed with the patient and family. After consideration of risks, benefits and other options for treatment, the patient has consented to  Procedure(s): COLONOSCOPY WITH PROPOFOL (N/A) as a surgical intervention.  The patient's history has been reviewed, patient examined, no change in status, stable for surgery.  I have reviewed the patient's chart and labs.  Questions were answered to the patient's satisfaction.     Lesly Rubenstein  Ok to proceed with colonoscopy

## 2022-01-09 NOTE — Anesthesia Preprocedure Evaluation (Addendum)
Anesthesia Evaluation  Patient identified by MRN, date of birth, ID band Patient awake    Reviewed: Allergy & Precautions, NPO status , Patient's Chart, lab work & pertinent test results, reviewed documented beta blocker date and time   History of Anesthesia Complications Negative for: history of anesthetic complications  Airway Mallampati: II       Dental no notable dental hx.    Pulmonary neg sleep apnea, neg COPD, Not current smoker, former smoker,    Pulmonary exam normal        Cardiovascular Exercise Tolerance: Good hypertension, Pt. on medications and Pt. on home beta blockers + CAD, + Past MI and + Cardiac Stents  (-) CHF Normal cardiovascular exam(-) dysrhythmias (-) Valvular Problems/Murmurs Rhythm:Regular Rate:Normal  Bilateral carotid artery stenosis   Neuro/Psych neg Seizures    GI/Hepatic Neg liver ROS, GERD  Medicated and Controlled,  Endo/Other  diabetes, Type 2, Oral Hypoglycemic Agents  Renal/GU negative Renal ROS     Musculoskeletal   Abdominal Normal abdominal exam  (+)   Peds  Hematology   Anesthesia Other Findings Sarcoidosis  Reproductive/Obstetrics                            Anesthesia Physical  Anesthesia Plan  ASA: III  Anesthesia Plan: General   Post-op Pain Management: Minimal or no pain anticipated   Induction: Intravenous  PONV Risk Score and Plan: Propofol infusion and TIVA  Airway Management Planned: Natural Airway  Additional Equipment:   Intra-op Plan:   Post-operative Plan:   Informed Consent: I have reviewed the patients History and Physical, chart, labs and discussed the procedure including the risks, benefits and alternatives for the proposed anesthesia with the patient or authorized representative who has indicated his/her understanding and acceptance.     Dental advisory given  Plan Discussed with: CRNA and  Anesthesiologist  Anesthesia Plan Comments:        Anesthesia Quick Evaluation

## 2022-01-09 NOTE — Transfer of Care (Signed)
Immediate Anesthesia Transfer of Care Note  Patient: Casey Dyer  Procedure(s) Performed: COLONOSCOPY WITH PROPOFOL  Patient Location: PACU  Anesthesia Type:General  Level of Consciousness: sedated  Airway & Oxygen Therapy: Patient Spontanous Breathing  Post-op Assessment: Report given to RN and Post -op Vital signs reviewed and stable  Post vital signs: Reviewed and stable  Last Vitals:  Vitals Value Taken Time  BP 123/74 01/09/22 1017  Temp 35.8 C 01/09/22 1017  Pulse 65 01/09/22 1018  Resp 14 01/09/22 1018  SpO2 100 % 01/09/22 1018  Vitals shown include unvalidated device data.  Last Pain:  Vitals:   01/09/22 1017  TempSrc: Tympanic  PainSc: Asleep         Complications: No notable events documented.

## 2022-01-09 NOTE — Op Note (Signed)
[facility] Patient Name: Casey Dyer Procedure Date: 01/09/2022 9:56 AM MRN: [id number] Account #: [id number] Date of Birth: 12/26/1945 Admit Type: Outpatient Age: 76 Room: [facility] ENDO ROOM 3 Gender: Male Note Status: Finalized Instrument Name: Park Meo [id number] Procedure:             Colonoscopy Indications:           High risk colon cancer surveillance: Personal history                         of colonic polyps, Last colonoscopy 5 years ago Providers:             Andrey Farmer MD, MD Medicines:             Monitored Anesthesia Care Complications:         No immediate complications. Procedure:             Pre-Anesthesia Assessment:                        - Prior to the procedure, a History and Physical was                         performed, and patient medications and allergies were                         reviewed. The patient is competent. The risks and                         benefits of the procedure and the sedation options and                         risks were discussed with the patient. All questions                         were answered and informed consent was obtained.                         Patient identification and proposed procedure were                         verified by the physician, the nurse, the                         anesthesiologist, the anesthetist and the technician                         in the endoscopy suite. Mental Status Examination:                         alert and oriented. Airway Examination: normal                         oropharyngeal airway and neck mobility. Respiratory                         Examination: clear to auscultation. CV Examination:                         normal. Prophylactic Antibiotics: The patient does  not                         require prophylactic antibiotics. Prior                         Anticoagulants: The patient has taken no anticoagulant                         or  antiplatelet agents. ASA Grade Assessment: III - A                         patient with severe systemic disease. After reviewing                         the risks and benefits, the patient was deemed in                         satisfactory condition to undergo the procedure. The                         anesthesia plan was to use monitored anesthesia care                         (MAC). Immediately prior to administration of                         medications, the patient was re-assessed for adequacy                         to receive sedatives. The heart rate, respiratory                         rate, oxygen saturations, blood pressure, adequacy of                         pulmonary ventilation, and response to care were                         monitored throughout the procedure. The physical                         status of the patient was re-assessed after the                         procedure.                        After obtaining informed consent, the colonoscope was                         passed under direct vision. Throughout the procedure,                         the patient's blood pressure, pulse, and oxygen                         saturations were monitored continuously. The  Colonoscope was introduced through the anus and                         advanced to the the cecum, identified by appendiceal                         orifice and ileocecal valve. The colonoscopy was                         somewhat difficult due to significant looping.                         Successful completion of the procedure was aided by                         applying abdominal pressure. The patient tolerated the                         procedure well. The quality of the bowel preparation                         was adequate to identify polyps. The ileocecal valve,                         appendiceal orifice, and rectum were photographed. Findings:      The perianal and  digital rectal examinations were normal.      Scattered small-mouthed diverticula were found in the sigmoid colon,       descending colon, transverse colon and ascending colon.      Internal hemorrhoids were found during retroflexion. The hemorrhoids       were Grade I (internal hemorrhoids that do not prolapse).      The exam was otherwise without abnormality on direct and retroflexion       views. Impression:            - Diverticulosis in the sigmoid colon, in the                         descending colon, in the transverse colon and in the                         ascending colon.                        - Internal hemorrhoids.                        - The examination was otherwise normal on direct and                         retroflexion views.                        - No specimens collected. Recommendation:        - Discharge patient to home.                        - Resume previous diet.                        -  Continue present medications.                        - Repeat colonoscopy is not recommended due to current                         age (66 years or older) for surveillance.                        - Return to referring physician as previously                         scheduled. Procedure Code(s):     --- Professional ---                        E0223, Colorectal cancer screening; colonoscopy on                         individual at high risk Diagnosis Code(s):     --- Professional ---                        Z86.010, Personal history of colonic polyps                        K64.0, First degree hemorrhoids                        K57.30, Diverticulosis of large intestine without                         perforation or abscess without bleeding CPT copyright 2022 [employer]. All rights reserved. The codes documented in this report are preliminary and upon coder review may  be revised to meet current compliance requirements. Andrey Farmer MD, MD 01/09/2022  10:17:21 AM Number of Addenda: 0 Note Initiated On: 01/09/2022 9:56 AM Scope Withdrawal Time: 0 hours 6 minutes 30 seconds  Total Procedure Duration: 0 hours 11 minutes 51 seconds  Estimated Blood Loss:  Estimated blood loss: none.      [facility]

## 2022-01-09 NOTE — Anesthesia Procedure Notes (Signed)
Date/Time: 01/09/2022 9:55 AM  Performed by: Johnna Acosta, CRNAPre-anesthesia Checklist: Patient identified, Emergency Drugs available, Suction available, Patient being monitored and Timeout performed Patient Re-evaluated:Patient Re-evaluated prior to induction Oxygen Delivery Method: Nasal cannula Preoxygenation: Pre-oxygenation with 100% oxygen Induction Type: IV induction

## 2022-01-09 NOTE — H&P (Signed)
Outpatient short stay form Pre-procedure 01/09/2022  Lesly Rubenstein, MD  Primary Physician: Juluis Pitch, MD  Reason for visit:  Surveillance/Family history of colon cancer  History of present illness:    76 y/o gentleman with history of hypertension here for surveillance colonoscopy for history of polyps and family history of colon cancer in mother in her 69's. No blood thinners. No significant abdominal surgeries.    Current Facility-Administered Medications:    0.9 %  sodium chloride infusion, , Intravenous, Continuous, Ronica Vivian, Hilton Cork, MD, Last Rate: 20 mL/hr at 01/09/22 3536, Restarted at 01/09/22 0949  Medications Prior to Admission  Medication Sig Dispense Refill Last Dose   celecoxib (CELEBREX) 200 MG capsule Take 1 capsule (200 mg total) by mouth 2 (two) times daily. 90 capsule 0 Past Month   lisinopril-hydrochlorothiazide (ZESTORETIC) 20-12.5 MG tablet Take 2 tablets by mouth daily.   01/09/2022   metFORMIN (GLUCOPHAGE-XR) 750 MG 24 hr tablet Take 750 mg by mouth daily.   Past Week   metoprolol succinate (TOPROL-XL) 25 MG 24 hr tablet Take 25 mg by mouth daily.   01/08/2022   simvastatin (ZOCOR) 40 MG tablet Take 40 mg by mouth daily.   01/08/2022   Cholecalciferol (VITAMIN D3) 50 MCG (2000 UT) capsule Take 2,000 Units by mouth daily.      enoxaparin (LOVENOX) 40 MG/0.4ML injection Inject 0.4 mLs (40 mg total) into the skin daily for 14 days. 5.6 mL 0    famotidine (PEPCID) 10 MG tablet Take 10 mg by mouth every evening.      glucosamine-chondroitin 500-400 MG tablet Take 1 tablet by mouth in the morning and at bedtime.      melatonin 5 MG TABS Take 5 mg by mouth at bedtime.      Omega-3 Fatty Acids (FISH OIL) 1000 MG CAPS Take 1,000 mg by mouth daily.      oxyCODONE (OXY IR/ROXICODONE) 5 MG immediate release tablet Take 1 tablet (5 mg total) by mouth every 4 (four) hours as needed for severe pain. 30 tablet 0    tadalafil (CIALIS) 20 MG tablet Take 20 mg by mouth  daily as needed for erectile dysfunction.      tamsulosin (FLOMAX) 0.4 MG CAPS capsule Take 1 capsule (0.4 mg total) by mouth daily. 30 capsule 0    traMADol (ULTRAM) 50 MG tablet Take 1 tablet (50 mg total) by mouth every 4 (four) hours as needed for moderate pain. 30 tablet 0      No Known Allergies   Past Medical History:  Diagnosis Date   Atherosclerotic peripheral vascular disease ([place])    Chronic ischemic heart disease    Coronary artery disease    Diabetes mellitus, type 2 (HCC)    ED (erectile dysfunction)    GERD (gastroesophageal reflux disease)    Hypertension    Mixed hyperlipidemia    Myocardial infarction ([place])    2007   Sarcoidosis    T2DM (type 2 diabetes mellitus) ([place])     Review of systems:  Otherwise negative.    Physical Exam  Gen: Alert, oriented. Appears stated age.  HEENT: PERRLA. Lungs: No respiratory distress CV: RRR Abd: soft, benign, no masses Ext: No edema    Planned procedures: Proceed with colonoscopy. The patient understands the nature of the planned procedure, indications, risks, alternatives and potential complications including but not limited to bleeding, infection, perforation, damage to internal organs and possible oversedation/side effects from anesthesia. The patient agrees and gives consent to proceed.  Please refer to procedure notes for findings, recommendations and patient disposition/instructions.     Lesly Rubenstein, MD [facility]enter[facility]

## 2022-01-10 ENCOUNTER — Encounter: Payer: Self-pay | Admitting: Gastroenterology

## 2022-01-10 NOTE — Anesthesia Postprocedure Evaluation (Signed)
Anesthesia Post Note  Patient: Casey Dyer  Procedure(s) Performed: COLONOSCOPY WITH PROPOFOL  Patient location during evaluation: PACU Anesthesia Type: General Level of consciousness: awake and alert Pain management: pain level controlled Vital Signs Assessment: post-procedure vital signs reviewed and stable Respiratory status: spontaneous breathing, nonlabored ventilation and respiratory function stable Cardiovascular status: blood pressure returned to baseline and stable Postop Assessment: no apparent nausea or vomiting Anesthetic complications: no   No notable events documented.   Last Vitals:  Vitals:   01/09/22 1027 01/09/22 1037  BP: 127/80 (!) 148/79  Pulse: 86 61  Resp: 14 12  Temp:    SpO2: 98% 100%    Last Pain:  Vitals:   01/10/22 0739  TempSrc:   PainSc: 0-No pain                 [place] Ouch

## 2022-08-19 IMAGING — DX DG KNEE 1-2V PORT*R*
2 series · 2 of 2 positions shown · non-contrast
Comparison: None.

CLINICAL DATA: Status post right knee replacement

EXAM:
PORTABLE RIGHT KNEE - 1-2 VIEW

[knee ap]
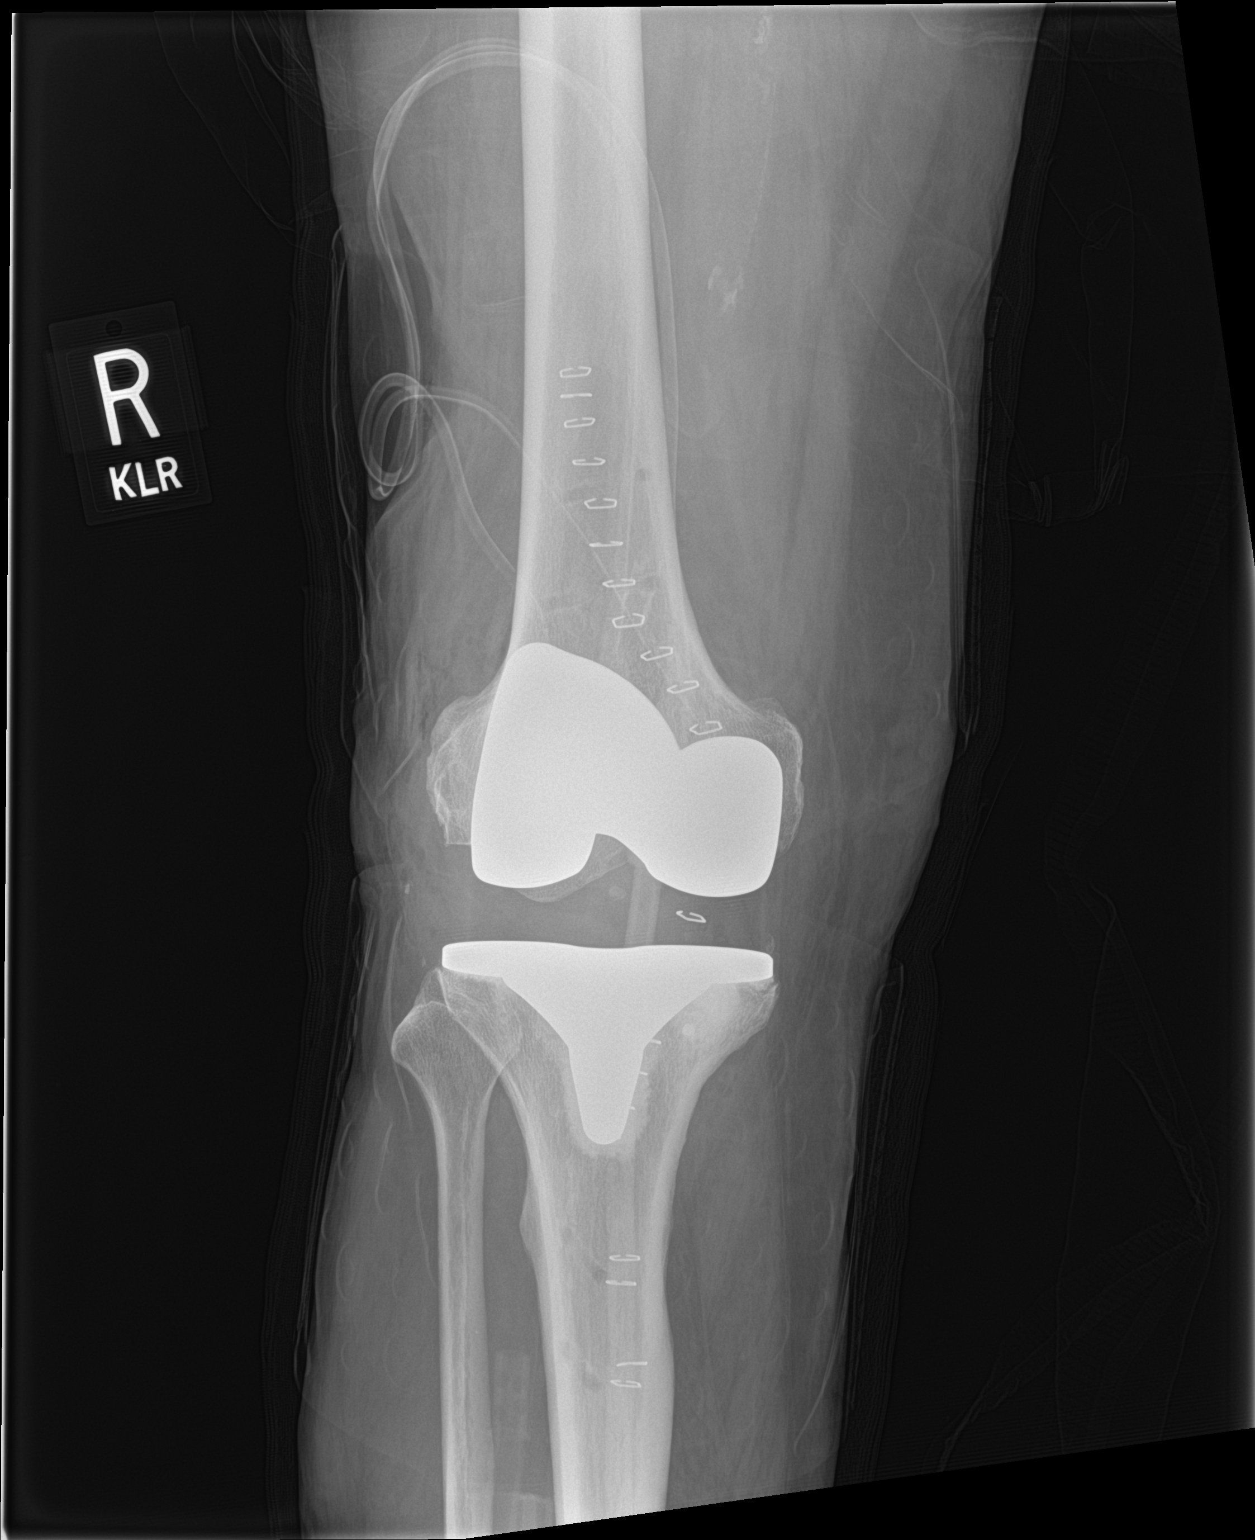

[knee lat]
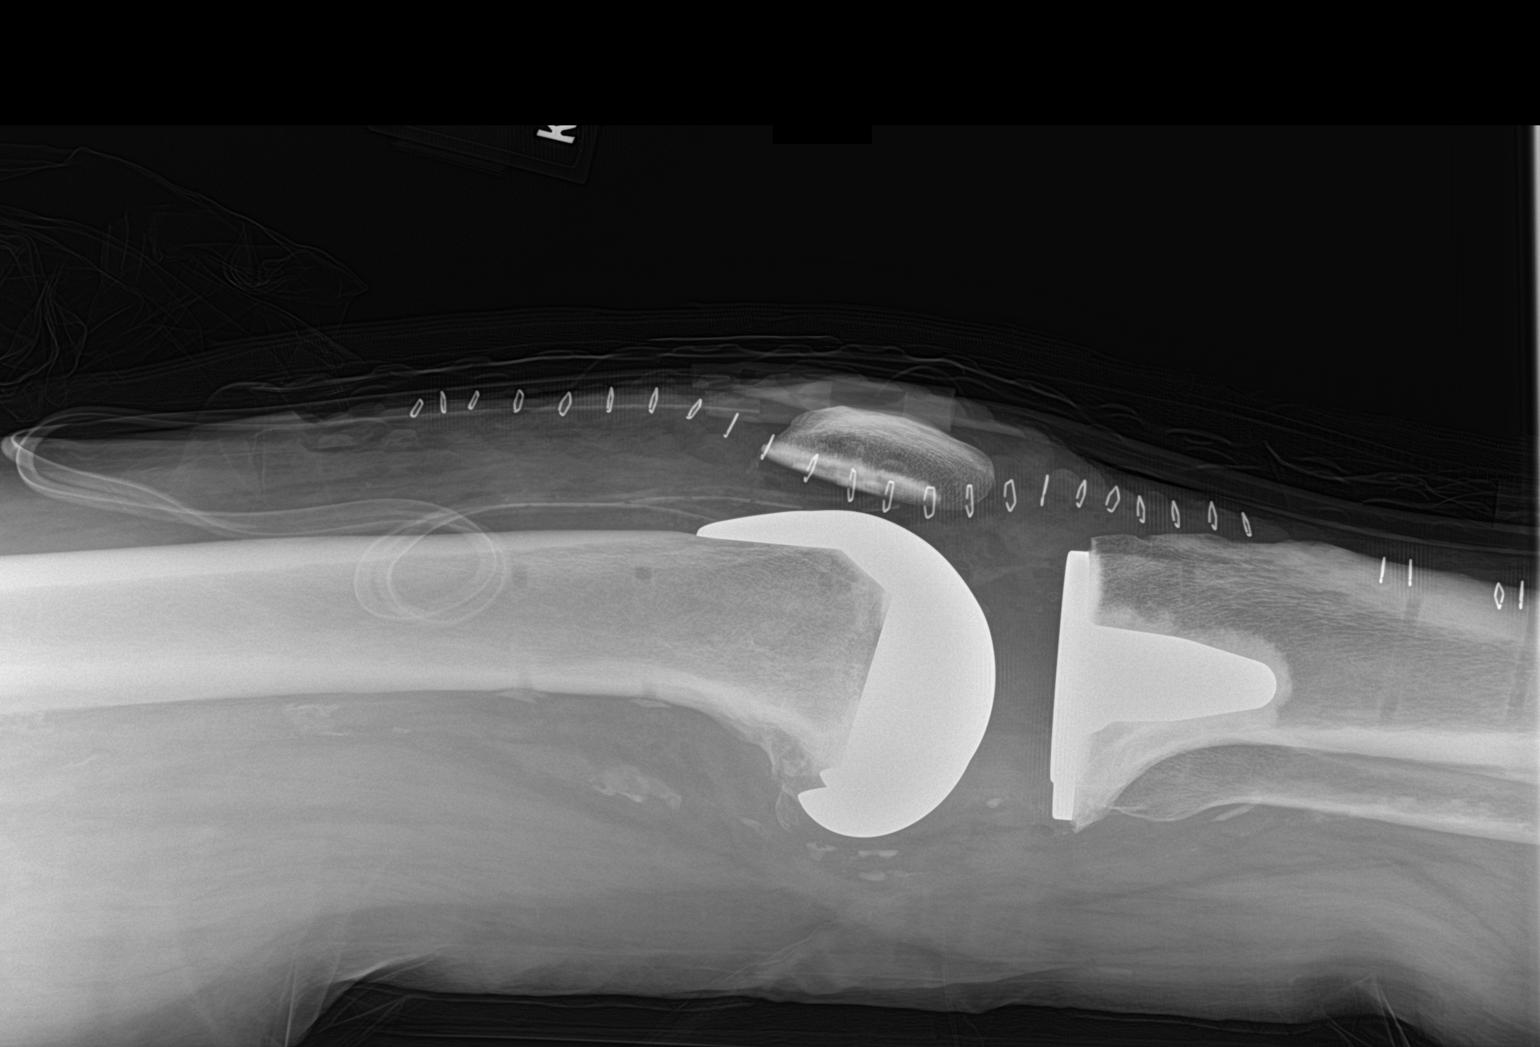

[2 of 2 positions shown; findings below may reference images not displayed]

FINDINGS: Right knee prosthesis is noted in satisfactory position. Surgical
drains are noted in place. No acute soft tissue abnormality is seen.
IMPRESSION: Status post right knee replacement.
